# Patient Record
Sex: Male | Born: 1935 | Race: White | Hispanic: No | Smoking: Never smoker
Health system: Southern US, Community
[De-identification: ages and names within clinical notes are randomized; demographics above are authoritative.]

## PROBLEM LIST (undated history)

## (undated) DIAGNOSIS — N189 Chronic kidney disease, unspecified: Secondary | ICD-10-CM

## (undated) DIAGNOSIS — F32A Depression, unspecified: Secondary | ICD-10-CM

## (undated) DIAGNOSIS — F0390 Unspecified dementia without behavioral disturbance: Secondary | ICD-10-CM

## (undated) DIAGNOSIS — I4891 Unspecified atrial fibrillation: Secondary | ICD-10-CM

## (undated) DIAGNOSIS — I10 Essential (primary) hypertension: Secondary | ICD-10-CM

## (undated) DIAGNOSIS — F419 Anxiety disorder, unspecified: Secondary | ICD-10-CM

## (undated) DIAGNOSIS — I499 Cardiac arrhythmia, unspecified: Secondary | ICD-10-CM

## (undated) HISTORY — PX: CHOLECYSTECTOMY: SHX55

## (undated) HISTORY — PX: CATARACT EXTRACTION: SUR2

## (undated) HISTORY — DX: Unspecified atrial fibrillation: I48.91

---

## 2002-03-31 ENCOUNTER — Encounter: Payer: Self-pay | Admitting: Urology

## 2002-04-05 ENCOUNTER — Inpatient Hospital Stay (HOSPITAL_COMMUNITY): Admission: RE | Admit: 2002-04-05 | Discharge: 2002-04-06 | Payer: Self-pay | Admitting: Urology

## 2017-09-25 DIAGNOSIS — N400 Enlarged prostate without lower urinary tract symptoms: Secondary | ICD-10-CM

## 2017-09-25 DIAGNOSIS — I4891 Unspecified atrial fibrillation: Secondary | ICD-10-CM

## 2017-09-25 DIAGNOSIS — E785 Hyperlipidemia, unspecified: Secondary | ICD-10-CM

## 2017-09-25 DIAGNOSIS — I10 Essential (primary) hypertension: Secondary | ICD-10-CM

## 2017-09-25 DIAGNOSIS — R0789 Other chest pain: Secondary | ICD-10-CM

## 2017-09-26 DIAGNOSIS — R079 Chest pain, unspecified: Secondary | ICD-10-CM | POA: Diagnosis not present

## 2017-11-29 DIAGNOSIS — N390 Urinary tract infection, site not specified: Secondary | ICD-10-CM | POA: Diagnosis not present

## 2017-11-29 DIAGNOSIS — R509 Fever, unspecified: Secondary | ICD-10-CM

## 2017-11-29 DIAGNOSIS — A4189 Other specified sepsis: Secondary | ICD-10-CM

## 2017-11-29 DIAGNOSIS — I248 Other forms of acute ischemic heart disease: Secondary | ICD-10-CM

## 2017-11-30 DIAGNOSIS — R509 Fever, unspecified: Secondary | ICD-10-CM

## 2017-11-30 DIAGNOSIS — N390 Urinary tract infection, site not specified: Secondary | ICD-10-CM | POA: Diagnosis not present

## 2017-11-30 DIAGNOSIS — I248 Other forms of acute ischemic heart disease: Secondary | ICD-10-CM | POA: Diagnosis not present

## 2017-11-30 DIAGNOSIS — A4189 Other specified sepsis: Secondary | ICD-10-CM | POA: Diagnosis not present

## 2017-12-01 DIAGNOSIS — A4189 Other specified sepsis: Secondary | ICD-10-CM | POA: Diagnosis not present

## 2017-12-01 DIAGNOSIS — R509 Fever, unspecified: Secondary | ICD-10-CM | POA: Diagnosis not present

## 2017-12-01 DIAGNOSIS — I248 Other forms of acute ischemic heart disease: Secondary | ICD-10-CM | POA: Diagnosis not present

## 2017-12-01 DIAGNOSIS — N390 Urinary tract infection, site not specified: Secondary | ICD-10-CM | POA: Diagnosis not present

## 2018-07-06 DIAGNOSIS — R9431 Abnormal electrocardiogram [ECG] [EKG]: Secondary | ICD-10-CM | POA: Diagnosis not present

## 2018-07-06 DIAGNOSIS — E876 Hypokalemia: Secondary | ICD-10-CM | POA: Diagnosis not present

## 2018-07-06 DIAGNOSIS — I160 Hypertensive urgency: Secondary | ICD-10-CM | POA: Diagnosis not present

## 2018-07-06 DIAGNOSIS — R55 Syncope and collapse: Secondary | ICD-10-CM

## 2018-07-06 DIAGNOSIS — T148XXA Other injury of unspecified body region, initial encounter: Secondary | ICD-10-CM

## 2018-07-06 DIAGNOSIS — R739 Hyperglycemia, unspecified: Secondary | ICD-10-CM | POA: Diagnosis not present

## 2018-07-07 DIAGNOSIS — R739 Hyperglycemia, unspecified: Secondary | ICD-10-CM | POA: Diagnosis not present

## 2018-07-07 DIAGNOSIS — I160 Hypertensive urgency: Secondary | ICD-10-CM | POA: Diagnosis not present

## 2018-07-07 DIAGNOSIS — R9431 Abnormal electrocardiogram [ECG] [EKG]: Secondary | ICD-10-CM | POA: Diagnosis not present

## 2018-07-07 DIAGNOSIS — E876 Hypokalemia: Secondary | ICD-10-CM | POA: Diagnosis not present

## 2018-07-07 DIAGNOSIS — I361 Nonrheumatic tricuspid (valve) insufficiency: Secondary | ICD-10-CM

## 2018-07-08 DIAGNOSIS — I272 Pulmonary hypertension, unspecified: Secondary | ICD-10-CM | POA: Diagnosis not present

## 2018-07-08 DIAGNOSIS — I160 Hypertensive urgency: Secondary | ICD-10-CM | POA: Diagnosis not present

## 2018-07-08 DIAGNOSIS — R9431 Abnormal electrocardiogram [ECG] [EKG]: Secondary | ICD-10-CM | POA: Diagnosis not present

## 2018-07-08 DIAGNOSIS — R739 Hyperglycemia, unspecified: Secondary | ICD-10-CM | POA: Diagnosis not present

## 2018-07-08 DIAGNOSIS — R55 Syncope and collapse: Secondary | ICD-10-CM

## 2018-07-08 DIAGNOSIS — E876 Hypokalemia: Secondary | ICD-10-CM | POA: Diagnosis not present

## 2018-07-08 DIAGNOSIS — I4891 Unspecified atrial fibrillation: Secondary | ICD-10-CM | POA: Diagnosis not present

## 2018-07-09 DIAGNOSIS — R739 Hyperglycemia, unspecified: Secondary | ICD-10-CM | POA: Diagnosis not present

## 2018-07-09 DIAGNOSIS — E876 Hypokalemia: Secondary | ICD-10-CM | POA: Diagnosis not present

## 2018-07-09 DIAGNOSIS — R9431 Abnormal electrocardiogram [ECG] [EKG]: Secondary | ICD-10-CM | POA: Diagnosis not present

## 2018-07-09 DIAGNOSIS — I4891 Unspecified atrial fibrillation: Secondary | ICD-10-CM | POA: Diagnosis not present

## 2018-07-09 DIAGNOSIS — R55 Syncope and collapse: Secondary | ICD-10-CM | POA: Diagnosis not present

## 2018-07-09 DIAGNOSIS — I160 Hypertensive urgency: Secondary | ICD-10-CM | POA: Diagnosis not present

## 2018-07-09 DIAGNOSIS — I272 Pulmonary hypertension, unspecified: Secondary | ICD-10-CM | POA: Diagnosis not present

## 2018-07-10 DIAGNOSIS — R55 Syncope and collapse: Secondary | ICD-10-CM | POA: Diagnosis not present

## 2018-07-10 DIAGNOSIS — E876 Hypokalemia: Secondary | ICD-10-CM | POA: Diagnosis not present

## 2018-07-10 DIAGNOSIS — I272 Pulmonary hypertension, unspecified: Secondary | ICD-10-CM | POA: Diagnosis not present

## 2018-07-10 DIAGNOSIS — R739 Hyperglycemia, unspecified: Secondary | ICD-10-CM | POA: Diagnosis not present

## 2018-07-10 DIAGNOSIS — I4891 Unspecified atrial fibrillation: Secondary | ICD-10-CM | POA: Diagnosis not present

## 2018-07-10 DIAGNOSIS — I160 Hypertensive urgency: Secondary | ICD-10-CM | POA: Diagnosis not present

## 2018-07-10 DIAGNOSIS — R9431 Abnormal electrocardiogram [ECG] [EKG]: Secondary | ICD-10-CM | POA: Diagnosis not present

## 2020-05-15 ENCOUNTER — Observation Stay (HOSPITAL_COMMUNITY)
Admission: EM | Admit: 2020-05-15 | Discharge: 2020-05-18 | Disposition: A | Discharge status: Home / Self Care | Payer: Medicare Other | Attending: Internal Medicine | Admitting: Internal Medicine

## 2020-05-15 ENCOUNTER — Emergency Department (HOSPITAL_COMMUNITY): Payer: Medicare Other

## 2020-05-15 ENCOUNTER — Other Ambulatory Visit: Payer: Self-pay

## 2020-05-15 ENCOUNTER — Encounter (HOSPITAL_COMMUNITY): Payer: Self-pay

## 2020-05-15 DIAGNOSIS — I482 Chronic atrial fibrillation, unspecified: Secondary | ICD-10-CM

## 2020-05-15 DIAGNOSIS — I214 Non-ST elevation (NSTEMI) myocardial infarction: Secondary | ICD-10-CM | POA: Insufficient documentation

## 2020-05-15 DIAGNOSIS — Z79899 Other long term (current) drug therapy: Secondary | ICD-10-CM | POA: Diagnosis not present

## 2020-05-15 DIAGNOSIS — Y92009 Unspecified place in unspecified non-institutional (private) residence as the place of occurrence of the external cause: Secondary | ICD-10-CM | POA: Diagnosis not present

## 2020-05-15 DIAGNOSIS — Z7982 Long term (current) use of aspirin: Secondary | ICD-10-CM | POA: Diagnosis not present

## 2020-05-15 DIAGNOSIS — N179 Acute kidney failure, unspecified: Secondary | ICD-10-CM

## 2020-05-15 DIAGNOSIS — F17220 Nicotine dependence, chewing tobacco, uncomplicated: Secondary | ICD-10-CM | POA: Diagnosis not present

## 2020-05-15 DIAGNOSIS — I5030 Unspecified diastolic (congestive) heart failure: Secondary | ICD-10-CM | POA: Diagnosis not present

## 2020-05-15 DIAGNOSIS — J441 Chronic obstructive pulmonary disease with (acute) exacerbation: Secondary | ICD-10-CM | POA: Insufficient documentation

## 2020-05-15 DIAGNOSIS — I130 Hypertensive heart and chronic kidney disease with heart failure and stage 1 through stage 4 chronic kidney disease, or unspecified chronic kidney disease: Secondary | ICD-10-CM | POA: Insufficient documentation

## 2020-05-15 DIAGNOSIS — Z20822 Contact with and (suspected) exposure to covid-19: Secondary | ICD-10-CM | POA: Diagnosis not present

## 2020-05-15 DIAGNOSIS — I10 Essential (primary) hypertension: Secondary | ICD-10-CM

## 2020-05-15 DIAGNOSIS — R55 Syncope and collapse: Secondary | ICD-10-CM

## 2020-05-15 DIAGNOSIS — W19XXXA Unspecified fall, initial encounter: Secondary | ICD-10-CM | POA: Insufficient documentation

## 2020-05-15 DIAGNOSIS — Z23 Encounter for immunization: Secondary | ICD-10-CM | POA: Diagnosis not present

## 2020-05-15 DIAGNOSIS — E860 Dehydration: Secondary | ICD-10-CM

## 2020-05-15 DIAGNOSIS — N183 Chronic kidney disease, stage 3 unspecified: Secondary | ICD-10-CM | POA: Insufficient documentation

## 2020-05-15 DIAGNOSIS — R0902 Hypoxemia: Secondary | ICD-10-CM

## 2020-05-15 DIAGNOSIS — F0390 Unspecified dementia without behavioral disturbance: Secondary | ICD-10-CM | POA: Insufficient documentation

## 2020-05-15 DIAGNOSIS — S0990XA Unspecified injury of head, initial encounter: Secondary | ICD-10-CM | POA: Diagnosis not present

## 2020-05-15 DIAGNOSIS — R778 Other specified abnormalities of plasma proteins: Secondary | ICD-10-CM

## 2020-05-15 HISTORY — DX: Essential (primary) hypertension: I10

## 2020-05-15 HISTORY — DX: Anxiety disorder, unspecified: F41.9

## 2020-05-15 HISTORY — DX: Chronic kidney disease, unspecified: N18.9

## 2020-05-15 HISTORY — DX: Depression, unspecified: F32.A

## 2020-05-15 HISTORY — DX: Unspecified dementia, unspecified severity, without behavioral disturbance, psychotic disturbance, mood disturbance, and anxiety: F03.90

## 2020-05-15 HISTORY — DX: Cardiac arrhythmia, unspecified: I49.9

## 2020-05-15 LAB — COMPREHENSIVE METABOLIC PANEL
ALT: 15 U/L (ref 0–44)
AST: 40 U/L (ref 15–41)
Albumin: 4 g/dL (ref 3.5–5.0)
Alkaline Phosphatase: 127 U/L — ABNORMAL HIGH (ref 38–126)
Anion gap: 10 (ref 5–15)
BUN: 13 mg/dL (ref 8–23)
CO2: 24 mmol/L (ref 22–32)
Calcium: 9.3 mg/dL (ref 8.9–10.3)
Chloride: 104 mmol/L (ref 98–111)
Creatinine, Ser: 1.93 mg/dL — ABNORMAL HIGH (ref 0.61–1.24)
GFR calc Af Amer: 36 mL/min — ABNORMAL LOW (ref >60)
GFR calc non Af Amer: 31 mL/min — ABNORMAL LOW (ref >60)
Glucose, Bld: 122 mg/dL — ABNORMAL HIGH (ref 70–99)
Potassium: 4.1 mmol/L (ref 3.5–5.1)
Sodium: 138 mmol/L (ref 135–145)
Total Bilirubin: 1.2 mg/dL (ref 0.3–1.2)
Total Protein: 7.1 g/dL (ref 6.5–8.1)

## 2020-05-15 LAB — CBC WITH DIFFERENTIAL/PLATELET
Abs Immature Granulocytes: 0.07 10*3/uL (ref 0.00–0.07)
Basophils Absolute: 0 10*3/uL (ref 0.0–0.1)
Basophils Relative: 1 %
Eosinophils Absolute: 0 10*3/uL (ref 0.0–0.5)
Eosinophils Relative: 1 %
HCT: 34.4 % — ABNORMAL LOW (ref 39.0–52.0)
Hemoglobin: 10.8 g/dL — ABNORMAL LOW (ref 13.0–17.0)
Immature Granulocytes: 1 %
Lymphocytes Relative: 15 %
Lymphs Abs: 1.2 10*3/uL (ref 0.7–4.0)
MCH: 30.5 pg (ref 26.0–34.0)
MCHC: 31.4 g/dL (ref 30.0–36.0)
MCV: 97.2 fL (ref 80.0–100.0)
Monocytes Absolute: 0.6 10*3/uL (ref 0.1–1.0)
Monocytes Relative: 7 %
Neutro Abs: 6.5 10*3/uL (ref 1.7–7.7)
Neutrophils Relative %: 75 %
Platelets: 168 10*3/uL (ref 150–400)
RBC: 3.54 MIL/uL — ABNORMAL LOW (ref 4.22–5.81)
RDW: 15.3 % (ref 11.5–15.5)
WBC: 8.4 10*3/uL (ref 4.0–10.5)
nRBC: 0 % (ref 0.0–0.2)

## 2020-05-15 MED ORDER — SODIUM CHLORIDE 0.9 % IV BOLUS
1000.0000 mL | Freq: Once | INTRAVENOUS | Status: AC
  Administered 2020-05-16: 1000 mL via INTRAVENOUS

## 2020-05-15 MED ORDER — ACETAMINOPHEN 325 MG PO TABS
650.0000 mg | ORAL_TABLET | Freq: Once | ORAL | Status: AC
  Administered 2020-05-16: 650 mg via ORAL
  Filled 2020-05-15: qty 2

## 2020-05-15 MED ORDER — TETANUS-DIPHTH-ACELL PERTUSSIS 5-2.5-18.5 LF-MCG/0.5 IM SUSP
0.5000 mL | Freq: Once | INTRAMUSCULAR | Status: AC
  Administered 2020-05-16: 0.5 mL via INTRAMUSCULAR
  Filled 2020-05-15: qty 0.5

## 2020-05-15 NOTE — ED Notes (Signed)
Pt returned from radiology via stretcher by transport.  

## 2020-05-15 NOTE — ED Provider Notes (Signed)
[facility] EMERGENCY DEPARTMENT Provider Note   CSN: [id number] Arrival date & time: 05/15/20  1821     History Chief Complaint  Patient presents with  . Loss of Consciousness    Donald Arellano is a 86 y.o. male with a past medical history of A. fib not currently on anticoagulation, heart failure with preserved ejection fraction, presenting to the ED with a chief complaint of syncope.  States that he wakes up every morning for the past several months feeling dizzy.  This improves throughout the day.  Today he was trying to use the bathroom when he felt dizzy your and apparently syncopized.  Did not have any preceding chest pain.  States that right now he feels at baseline, denies any headache, blurry vision, numbness in arms or legs, chest pain, shortness of breath.  Reports normal activity and appetite level, ambulates at baseline with his walker.  States that he had similar recurrent syncope about 2 years ago but is unsure why. [facility] cardiology note states that he is not on anticoagulation for his A. fib due to recurrent falls and injuries.  He is compliant with his home medications.  HPI     Past Medical History:  Diagnosis Date  . Atrial fibrillation (HCC)     There are no problems to display for this patient.    No family history on file.  Social History   Tobacco Use  . Smoking status: Never Smoker  . Smokeless tobacco: Current User    Types: Chew  Vaping Use  . Vaping Use: Never used  Substance Use Topics  . Alcohol use: Not Currently  . Drug use: Never    Home Medications Prior to Admission medications   Medication Sig Start Date End Date Taking? Authorizing Provider  albuterol (VENTOLIN HFA) 108 (90 Base) MCG/ACT inhaler Inhale 2 puffs into the lungs every 4 (four) hours as needed for wheezing or shortness of breath.  01/04/19  Yes [provider]  aspirin EC 81 MG tablet Take 81 mg by mouth every morning. Swallow whole.   Yes  [provider]  atorvastatin (LIPITOR) 20 MG tablet Take 20 mg by mouth daily with supper. 05/15/20  Yes [provider]  cetirizine (ZYRTEC) 10 MG tablet Take 10 mg by mouth daily with supper.   Yes [provider]  donepezil (ARICEPT) 10 MG tablet Take 10 mg by mouth daily with supper. 04/02/20  Yes [provider]  metoprolol succinate (TOPROL-XL) 50 MG 24 hr tablet Take 25 mg by mouth every morning. 03/25/20  Yes [provider]  Multiple Vitamin (MULTIVITAMIN WITH MINERALS) TABS tablet Take 1 tablet by mouth daily with supper.   Yes [provider]  potassium chloride (MICRO-K) 10 MEQ CR capsule Take 10 mEq by mouth every morning. 04/22/20  Yes [provider]  sertraline (ZOLOFT) 50 MG tablet Take 75 mg by mouth every morning. 03/04/20  Yes [provider]  tamsulosin (FLOMAX) 0.4 MG CAPS capsule Take 0.4 mg by mouth every morning. 02/27/20  Yes [provider]  torsemide (DEMADEX) 20 MG tablet Take 40 mg by mouth every morning. 04/27/20  Yes [provider]  vitamin B-12 (CYANOCOBALAMIN) 1000 MCG tablet Take 1,000 mcg by mouth every morning.   Yes [provider]    Allergies    Meloxicam  Review of Systems   Review of Systems  Constitutional: Negative for appetite change, chills and fever.  HENT: Negative for ear pain, rhinorrhea,  sneezing and sore throat.   Eyes: Negative for photophobia and visual disturbance.  Respiratory: Negative for cough, chest tightness, shortness of breath and wheezing.   Cardiovascular: Negative for chest pain and palpitations.  Gastrointestinal: Negative for abdominal pain, blood in stool, constipation, diarrhea, nausea and vomiting.  Genitourinary: Negative for dysuria, hematuria and urgency.  Musculoskeletal: Negative for myalgias.  Skin: Negative for rash.  Neurological: Positive for dizziness and light-headedness. Negative for weakness.    Physical  Exam Updated Vital Signs BP 129/60 (BP Location: Left Arm)   Pulse 72   Temp 98.6 F (37 C) (Oral)   Resp 20   SpO2 93%   Physical Exam Vitals and nursing note reviewed.  Constitutional:      General: He is not in acute distress.    Appearance: He is well-developed.  HENT:     Head: Normocephalic. Abrasion present.      Nose: Nose normal.  Eyes:     General: No scleral icterus.       Right eye: No discharge.        Left eye: No discharge.     Conjunctiva/sclera: Conjunctivae normal.     Pupils: Pupils are equal, round, and reactive to light.     Comments: Constricted but reactive pupils bilaterally.  Cardiovascular:     Rate and Rhythm: Normal rate and regular rhythm.     Heart sounds: Normal heart sounds. No murmur heard.  No friction rub. No gallop.   Pulmonary:     Effort: Pulmonary effort is normal. No respiratory distress.     Breath sounds: Normal breath sounds.  Abdominal:     General: Bowel sounds are normal. There is no distension.     Palpations: Abdomen is soft.     Tenderness: There is no abdominal tenderness. There is no guarding.  Musculoskeletal:        General: Normal range of motion.     Cervical back: Normal range of motion and neck supple.     Comments: No midline spinal tenderness present in lumbar, thoracic or cervical spine. No step-off palpated. No visible bruising, edema or temperature change noted. No objective signs of numbness present. No saddle anesthesia. 2+ DP pulses bilaterally. Sensation intact to light touch.   Skin:    General: Skin is warm and dry.     Findings: No rash.     Comments: Hematoma posterior scalp. Abrasion noted to posterior scalp x2.  Neurological:     General: No focal deficit present.     Mental Status: He is alert. Mental status is at baseline.     Cranial Nerves: No cranial nerve deficit.     Sensory: No sensory deficit.     Motor: No weakness or abnormal muscle tone.     Coordination: Coordination normal.      Comments: Alert, oriented to self.  Knows it is the year 2021.  Does not know which city he is in.  Able to name family members.  No facial asymmetry noted.  Strength 5/5 in bilateral upper and lower extremities.     ED Results / Procedures / Treatments   Labs (all labs ordered are listed, but only abnormal results are displayed) Labs Reviewed  COMPREHENSIVE METABOLIC PANEL - Abnormal; Notable for the following components:      Result Value   Glucose, Bld 122 (*)    Creatinine, Ser 1.93 (*)    Alkaline Phosphatase 127 (*)    GFR calc non Af Amer 31 (*)  GFR calc Af Amer 36 (*)    All other components within normal limits  CBC WITH DIFFERENTIAL/PLATELET - Abnormal; Notable for the following components:   RBC 3.54 (*)    Hemoglobin 10.8 (*)    HCT 34.4 (*)    All other components within normal limits  URINE CULTURE  URINALYSIS, ROUTINE W REFLEX MICROSCOPIC  TROPONIN I (HIGH SENSITIVITY)    EKG EKG Interpretation  Date/Time:  Monday May 15 2020 20:35:33 EDT Ventricular Rate:  62 PR Interval:    QRS Duration: 104 QT Interval:  490 QTC Calculation: 497 R Axis:   118 Text Interpretation: Atrial fibrillation with premature ventricular or aberrantly conducted complexes Nonspecific ST abnormality Prolonged QT Confirmed by Cathren Laine ([id number]) on 05/15/2020 8:54:11 PM   Radiology DG Chest 2 View  Result Date: 05/15/2020 CLINICAL DATA:  Loss of consciousness, syncope EXAM: CHEST - 2 VIEW COMPARISON:  07/06/2018 FINDINGS: Frontal and lateral views of the chest demonstrate a stable enlarged cardiac silhouette. Continued ectasia of the thoracic aorta. There is diffuse interstitial prominence, with small bilateral pleural effusions. No pneumothorax. IMPRESSION: 1. Mild interstitial edema. Electronically Signed   By: Sharlet Salina M.D.   On: 05/15/2020 19:35   CT Head Wo Contrast  Result Date: 05/15/2020 CLINICAL DATA:  Syncope.  Hematoma to posterior head. EXAM: CT HEAD  WITHOUT CONTRAST TECHNIQUE: Contiguous axial images were obtained from the base of the skull through the vertex without intravenous contrast. COMPARISON:  07/06/2018 FINDINGS: Brain: No evidence of acute infarction, hemorrhage, hydrocephalus, extra-axial collection or mass lesion/mass effect. There is mild diffuse low-attenuation within the subcortical and periventricular white matter compatible with chronic microvascular disease. Vascular: No hyperdense vessel or unexpected calcification. Skull: Normal. Negative for fracture or focal lesion. Sinuses/Orbits: No acute finding. Other: Right posterior scalp laceration and hematoma measures 0.9 x 3.6 cm, image 11/3 IMPRESSION: 1. No acute intracranial abnormalities. 2. Chronic small vessel ischemic disease and brain atrophy. 3. Right posterior scalp laceration and hematoma. Electronically Signed   By: Signa Kell M.D.   On: 05/15/2020 19:21    Procedures Procedures (including critical care time)  Medications Ordered in ED Medications  Tdap (BOOSTRIX) injection 0.5 mL (has no administration in time range)  sodium chloride 0.9 % bolus 1,000 mL (has no administration in time range)    ED Course  I have reviewed the triage vital signs and the nursing notes.  Pertinent labs & imaging results that were available during my care of the patient were reviewed by me and considered in my medical decision making (see chart for details).  Clinical Course as of May 15 2100  Mon May 15, 2020  2047 Care everywhere shows most recent lab work from March 2020 with creatinine of 1.07.  Creatinine(!): 1.93 [HK]    Clinical Course User Index [HK] Dietrich Pates, PA-C   MDM Rules/Calculators/A&P                          86 year old male with past medical history of A. fib not currently on anticoagulation, heart failure with preserved ejection fraction presenting to the ED with a chief complaint of syncope.  Wakes up every morning for the past several months feeling  dizzy.  This improves throughout the day.  An episode of dizziness when he went to the bathroom today followed by a syncopal episode.  No preceding current chest pain.  Feels at his baseline now.  Denies headache, blurry vision, numbness in arms  or legs, shortness of breath.  Ambulates with a walker at baseline.  Patient with hematoma and abrasion to posterior scalp. No numbness or weakness noted of bilateral upper and lower extremities.  He is alert, oriented to his baseline.  Moving all extremities without difficulty.  No C, T or L-spine tenderness at the midline..  CT of the head without any acute abnormality does show a scalp hematoma and laceration.  Wound care performed and Tdap updated.  CMP with creatinine of 1.9, most recent lab work from March 2020 which showed creatinine of 1. Will treat with IV fluids, as this could be the cause of his dizziness. Care handed off to Dr. Denton Lank pending remainder of work-up and disposition.  Portions of this note were generated with Scientist, clinical (histocompatibility and immunogenetics). Dictation errors may occur despite best attempts at proofreading.  Final Clinical Impression(s) / ED Diagnoses Final diagnoses:  Fall in home, initial encounter  Injury of head, initial encounter  Dehydration    Rx / DC Orders ED Discharge Orders    None       Brooks Sailors 05/15/20 2113    Cathren Laine, MD 05/15/20 2135    Cathren Laine, MD 05/15/20 2356    Cathren Laine, MD 05/16/20 0009

## 2020-05-15 NOTE — ED Triage Notes (Signed)
Pt to hall 3 via [facility]. Pt was having a BM and passed out at home. Pt's niece whom he lives with heard him and she went to check on him. Pt was unresponsive for approximately . Pt alert, confused on arrival. Pt states he doesn't feel good and hasn't felt good for weeks. Pt has large hematoma to posterior head. Pt's niece is POA, Viviana Simpler, [phone].

## 2020-05-15 NOTE — ED Notes (Signed)
Donald Arellano  [phone]  POA please call  with an update

## 2020-05-16 ENCOUNTER — Encounter (HOSPITAL_COMMUNITY): Payer: Self-pay | Admitting: Family Medicine

## 2020-05-16 ENCOUNTER — Observation Stay (HOSPITAL_BASED_OUTPATIENT_CLINIC_OR_DEPARTMENT_OTHER): Payer: Medicare Other

## 2020-05-16 DIAGNOSIS — I214 Non-ST elevation (NSTEMI) myocardial infarction: Secondary | ICD-10-CM | POA: Diagnosis not present

## 2020-05-16 DIAGNOSIS — I361 Nonrheumatic tricuspid (valve) insufficiency: Secondary | ICD-10-CM | POA: Diagnosis not present

## 2020-05-16 DIAGNOSIS — I340 Nonrheumatic mitral (valve) insufficiency: Secondary | ICD-10-CM | POA: Diagnosis not present

## 2020-05-16 DIAGNOSIS — R55 Syncope and collapse: Secondary | ICD-10-CM

## 2020-05-16 DIAGNOSIS — I10 Essential (primary) hypertension: Secondary | ICD-10-CM

## 2020-05-16 DIAGNOSIS — N183 Chronic kidney disease, stage 3 unspecified: Secondary | ICD-10-CM

## 2020-05-16 DIAGNOSIS — I482 Chronic atrial fibrillation, unspecified: Secondary | ICD-10-CM

## 2020-05-16 LAB — BASIC METABOLIC PANEL
Anion gap: 12 (ref 5–15)
BUN: 15 mg/dL (ref 8–23)
CO2: 20 mmol/L — ABNORMAL LOW (ref 22–32)
Calcium: 8.8 mg/dL — ABNORMAL LOW (ref 8.9–10.3)
Chloride: 105 mmol/L (ref 98–111)
Creatinine, Ser: 1.9 mg/dL — ABNORMAL HIGH (ref 0.61–1.24)
GFR calc Af Amer: 36 mL/min — ABNORMAL LOW (ref >60)
GFR calc non Af Amer: 31 mL/min — ABNORMAL LOW (ref >60)
Glucose, Bld: 101 mg/dL — ABNORMAL HIGH (ref 70–99)
Potassium: 3.6 mmol/L (ref 3.5–5.1)
Sodium: 137 mmol/L (ref 135–145)

## 2020-05-16 LAB — ECHOCARDIOGRAM COMPLETE: S' Lateral: 3.1 cm

## 2020-05-16 LAB — URINALYSIS, ROUTINE W REFLEX MICROSCOPIC
Bacteria, UA: NONE SEEN
Bilirubin Urine: NEGATIVE
Glucose, UA: NEGATIVE mg/dL
Hgb urine dipstick: NEGATIVE
Ketones, ur: NEGATIVE mg/dL
Nitrite: NEGATIVE
Protein, ur: NEGATIVE mg/dL
Specific Gravity, Urine: 1.004 — ABNORMAL LOW (ref 1.005–1.030)
pH: 7 (ref 5.0–8.0)

## 2020-05-16 LAB — CBC
HCT: 32.5 % — ABNORMAL LOW (ref 39.0–52.0)
Hemoglobin: 10.4 g/dL — ABNORMAL LOW (ref 13.0–17.0)
MCH: 30.4 pg (ref 26.0–34.0)
MCHC: 32 g/dL (ref 30.0–36.0)
MCV: 95 fL (ref 80.0–100.0)
Platelets: 146 10*3/uL — ABNORMAL LOW (ref 150–400)
RBC: 3.42 MIL/uL — ABNORMAL LOW (ref 4.22–5.81)
RDW: 15.3 % (ref 11.5–15.5)
WBC: 7.4 10*3/uL (ref 4.0–10.5)
nRBC: 0 % (ref 0.0–0.2)

## 2020-05-16 LAB — SARS CORONAVIRUS 2 BY RT PCR (HOSPITAL ORDER, PERFORMED IN ~~LOC~~ HOSPITAL LAB): SARS Coronavirus 2: NEGATIVE

## 2020-05-16 LAB — TROPONIN I (HIGH SENSITIVITY)
Troponin I (High Sensitivity): 133 ng/L (ref <18)
Troponin I (High Sensitivity): 132 ng/L (ref <18)
Troponin I (High Sensitivity): 122 ng/L (ref <18)
Troponin I (High Sensitivity): 94 ng/L — ABNORMAL HIGH (ref <18)

## 2020-05-16 MED ORDER — ASPIRIN EC 81 MG PO TBEC
81.0000 mg | DELAYED_RELEASE_TABLET | Freq: Every day | ORAL | Status: DC
  Administered 2020-05-17 – 2020-05-18 (×2): 81 mg via ORAL
  Filled 2020-05-16 (×2): qty 1

## 2020-05-16 MED ORDER — SERTRALINE HCL 50 MG PO TABS
75.0000 mg | ORAL_TABLET | Freq: Every morning | ORAL | Status: DC
  Administered 2020-05-16 – 2020-05-18 (×3): 75 mg via ORAL
  Filled 2020-05-16 (×2): qty 1
  Filled 2020-05-16 (×2): qty 2

## 2020-05-16 MED ORDER — TAMSULOSIN HCL 0.4 MG PO CAPS
0.4000 mg | ORAL_CAPSULE | Freq: Every morning | ORAL | Status: DC
  Administered 2020-05-16 – 2020-05-18 (×3): 0.4 mg via ORAL
  Filled 2020-05-16 (×3): qty 1

## 2020-05-16 MED ORDER — NITROGLYCERIN 0.4 MG SL SUBL: 0.4000 mg | SUBLINGUAL_TABLET | SUBLINGUAL | Status: DC | PRN

## 2020-05-16 MED ORDER — HEPARIN SODIUM (PORCINE) 5000 UNIT/ML IJ SOLN
5000.0000 [IU] | Freq: Three times a day (TID) | INTRAMUSCULAR | Status: DC
  Administered 2020-05-16 – 2020-05-18 (×7): 5000 [IU] via SUBCUTANEOUS
  Filled 2020-05-16 (×6): qty 1

## 2020-05-16 MED ORDER — LACTATED RINGERS IV SOLN
INTRAVENOUS | Status: DC
  Administered 2020-05-16: 50 mL via INTRAVENOUS

## 2020-05-16 MED ORDER — DONEPEZIL HCL 10 MG PO TABS
10.0000 mg | ORAL_TABLET | Freq: Every day | ORAL | Status: DC
  Administered 2020-05-16 – 2020-05-17 (×2): 10 mg via ORAL
  Filled 2020-05-16 (×3): qty 1

## 2020-05-16 MED ORDER — METOPROLOL SUCCINATE ER 25 MG PO TB24
25.0000 mg | ORAL_TABLET | Freq: Every morning | ORAL | Status: DC
  Administered 2020-05-16 – 2020-05-18 (×3): 25 mg via ORAL
  Filled 2020-05-16 (×3): qty 1

## 2020-05-16 MED ORDER — ONDANSETRON HCL 4 MG/2ML IJ SOLN: 4.0000 mg | Freq: Four times a day (QID) | INTRAMUSCULAR | Status: DC | PRN

## 2020-05-16 MED ORDER — ACETAMINOPHEN 325 MG PO TABS: 650.0000 mg | ORAL_TABLET | ORAL | Status: DC | PRN

## 2020-05-16 MED ORDER — ASPIRIN EC 81 MG PO TBEC
81.0000 mg | DELAYED_RELEASE_TABLET | Freq: Every morning | ORAL | Status: DC
  Administered 2020-05-16: 81 mg via ORAL
  Filled 2020-05-16: qty 1

## 2020-05-16 MED ORDER — ATORVASTATIN CALCIUM 10 MG PO TABS
20.0000 mg | ORAL_TABLET | Freq: Every day | ORAL | Status: DC
  Administered 2020-05-16 – 2020-05-17 (×2): 20 mg via ORAL
  Filled 2020-05-16 (×2): qty 2

## 2020-05-16 MED ORDER — TORSEMIDE 20 MG PO TABS
40.0000 mg | ORAL_TABLET | Freq: Every morning | ORAL | Status: DC
  Administered 2020-05-16 – 2020-05-18 (×3): 40 mg via ORAL
  Filled 2020-05-16 (×3): qty 2

## 2020-05-16 NOTE — Progress Notes (Signed)
PROGRESS NOTE    Donald Arellano  ZOX:[id number] DOB: 26-Nov-1931 DOA: 05/15/2020 PCP: Mare Ferrari, MD   Brief Narrative:  Donald Arellano is a 86 y.o. male with medical history significant for atrial fibrillation not on anticoagulation, CKD, mild dementia, hypertension who presents after syncopal event at home.  Reports he went to the bathroom and after having a bowel movement he stood up and felt very lightheaded so he sat back down the toilet and the next thing he knows he woke up on the floor.  His niece who lives with found him on the floor.  No report of any seizure activity.  No family is at bedside.  Patient denies having any chest pain or palpitations prior to the event.  He states that when he stood up he felt lightheaded but was not dizzy and just had a feeling of generalized weakness.  He denies any shortness of breath, diaphoresis, nausea, vomiting.  He has had syncopal events in the past.  He is not on anticoagulation with his chronic atrial fibrillation secondary to multiple falls at home.  At this time he states that he feels in his normal state of health and has no complaints. Denies tobacco, alcohol, illicit drug use.  Lives with his niece and her husband. In the emergency room patient was found to have an elevated troponin level.  Remainder of work-up was unremarkable.  EKG shows atrial fibrillation with prolonged QT interval but no acute ST elevation or depression.   Assessment & Plan:   Principal Problem:   NSTEMI (non-ST elevated myocardial infarction) [facility]) Active Problems:   Syncope   Atrial fibrillation, chronic (HCC)   Essential hypertension   CKD (chronic kidney disease) stage 3, GFR 30-59 ml/min   Syncope, likely orthostatic, POA Check orthostatic blood pressures upon arrival to floor - pending Syncopal episode after he stood up PT/OT to follow  NSTEMI (non-ST elevated myocardial infarction) (HCC) Troponin minimally down trending - currently 94, no longer  following Likely secondary to transient low BP (supply demand mismatch - type 2)  Atrial fibrillation, chronic (HCC) Patient has atrial fibrillation and is not anticoagulated secondary to multiple falls and syncopal events in past - high risk for bleeding events  Essential hypertension Continue metoprolol.  Monitor blood pressure  CKD (chronic kidney disease) stage 3, GFR 30-59 ml/min Appears to be at baseline, follow labs   DVT prophylaxis:  Padua score elevated.  Heparin for DVT prophylaxis Code Status:   Full code Family Communication: At bedside  Status is: Observation  Dispo: The patient is from: Home              Anticipated d/c is to: Home              Anticipated d/c date is: 24-48h              Patient currently not medically stable for discharge due to ongoing need for evaluation and workup with repeat labs and physical therapy.  Consultants:   None  Procedures:   None  Antimicrobials:  None indicated   Subjective: No acute issues/events overnight - denies chest pain, nausea, vomiting, headache, fevers or chills  Objective: Vitals:   05/16/20 0245 05/16/20 0300 05/16/20 0315 05/16/20 0832  BP:  (!) 158/86  126/86  Pulse: 69 68 70 61  Resp: 20 18 (!) 22 20  Temp:      TempSrc:      SpO2: 90% 94% 90% 94%   No intake or output  data in the 24 hours ending 05/16/20 0856 There were no vitals filed for this visit.  Examination:  General exam: Appears calm and comfortable  Respiratory system: Clear to auscultation. Respiratory effort normal. Cardiovascular system: S1 & S2 heard, RRR. No JVD, murmurs, rubs, gallops or clicks. No pedal edema. Gastrointestinal system: Abdomen is nondistended, soft and nontender. No organomegaly or masses felt. Normal bowel sounds heard. Central nervous system: Alert and oriented. No focal neurological deficits. Extremities: Symmetric 5 x 5 power. Skin: No rashes, lesions or ulcers Psychiatry: Judgement and insight  appear normal. Mood & affect appropriate.     Data Reviewed: I have personally reviewed following labs and imaging studies  CBC: Recent Labs  Lab 05/15/20 1944 05/16/20 0433  WBC 8.4 7.4  NEUTROABS 6.5  --   HGB 10.8* 10.4*  HCT 34.4* 32.5*  MCV 97.2 95.0  PLT 168 146*   Basic Metabolic Panel: Recent Labs  Lab 05/15/20 1944 05/16/20 0433  NA 138 137  K 4.1 3.6  CL 104 105  CO2 24 20*  GLUCOSE 122* 101*  BUN 13 15  CREATININE 1.93* 1.90*  CALCIUM 9.3 8.8*   GFR: CrCl cannot be calculated (Unknown ideal weight.). Liver Function Tests: Recent Labs  Lab 05/15/20 1944  AST 40  ALT 15  ALKPHOS 127*  BILITOT 1.2  PROT 7.1  ALBUMIN 4.0   No results for input(s): LIPASE, AMYLASE in the last 168 hours. No results for input(s): AMMONIA in the last 168 hours. Coagulation Profile: No results for input(s): INR, PROTIME in the last 168 hours. Cardiac Enzymes: No results for input(s): CKTOTAL, CKMB, CKMBINDEX, TROPONINI in the last 168 hours. BNP (last 3 results) No results for input(s): PROBNP in the last 8760 hours. HbA1C: No results for input(s): HGBA1C in the last 72 hours. CBG: No results for input(s): GLUCAP in the last 168 hours. Lipid Profile: No results for input(s): CHOL, HDL, LDLCALC, TRIG, CHOLHDL, LDLDIRECT in the last 72 hours. Thyroid Function Tests: No results for input(s): TSH, T4TOTAL, FREET4, T3FREE, THYROIDAB in the last 72 hours. Anemia Panel: No results for input(s): VITAMINB12, FOLATE, FERRITIN, TIBC, IRON, RETICCTPCT in the last 72 hours. Sepsis Labs: No results for input(s): PROCALCITON, LATICACIDVEN in the last 168 hours.  Recent Results (from the past 240 hour(s))  SARS Coronavirus 2 by RT PCR (hospital order, performed in [facility] hospital lab) Nasopharyngeal Nasopharyngeal Swab     Status: None   Collection Time: 05/16/20  3:11 AM   Specimen: Nasopharyngeal Swab  Result Value Ref Range Status   SARS Coronavirus 2 NEGATIVE NEGATIVE  Final    Comment: (NOTE) SARS-CoV-2 target nucleic acids are NOT DETECTED.  The SARS-CoV-2 RNA is generally detectable in upper and lower respiratory specimens during the acute phase of infection. The lowest concentration of SARS-CoV-2 viral copies this assay can detect is 250 copies / mL. A negative result does not preclude SARS-CoV-2 infection and should not be used as the sole basis for treatment or other patient management decisions.  A negative result may occur with improper specimen collection / handling, submission of specimen other than nasopharyngeal swab, presence of viral mutation(s) within the areas targeted by this assay, and inadequate number of viral copies (<250 copies / mL). A negative result must be combined with clinical observations, patient history, and epidemiological information.  Fact Sheet for Patients:   BoilerBrush.com.cy  Fact Sheet for Healthcare Providers: https://pope.com/  This test is not yet approved or  cleared by the [place] FDA  and has been authorized for detection and/or diagnosis of SARS-CoV-2 by FDA under an Emergency Use Authorization (EUA).  This EUA will remain in effect (meaning this test can be used) for the duration of the COVID-19 declaration under Section 564(b)(1) of the Act, 21 U.S.C. section 360bbb-3(b)(1), unless the authorization is terminated or revoked sooner.  Performed at [facility], [address], [place], [place] [address]          Radiology Studies: DG Chest 2 View  Result Date: 05/15/2020 CLINICAL DATA:  Loss of consciousness, syncope EXAM: CHEST - 2 VIEW COMPARISON:  07/06/2018 FINDINGS: Frontal and lateral views of the chest demonstrate a stable enlarged cardiac silhouette. Continued ectasia of the thoracic aorta. There is diffuse interstitial prominence, with small bilateral pleural effusions. No pneumothorax. IMPRESSION: 1. Mild interstitial edema.  Electronically Signed   By: Sharlet Salina M.D.   On: 05/15/2020 19:35   CT Head Wo Contrast  Result Date: 05/15/2020 CLINICAL DATA:  Syncope.  Hematoma to posterior head. EXAM: CT HEAD WITHOUT CONTRAST TECHNIQUE: Contiguous axial images were obtained from the base of the skull through the vertex without intravenous contrast. COMPARISON:  07/06/2018 FINDINGS: Brain: No evidence of acute infarction, hemorrhage, hydrocephalus, extra-axial collection or mass lesion/mass effect. There is mild diffuse low-attenuation within the subcortical and periventricular white matter compatible with chronic microvascular disease. Vascular: No hyperdense vessel or unexpected calcification. Skull: Normal. Negative for fracture or focal lesion. Sinuses/Orbits: No acute finding. Other: Right posterior scalp laceration and hematoma measures 0.9 x 3.6 cm, image 11/3 IMPRESSION: 1. No acute intracranial abnormalities. 2. Chronic small vessel ischemic disease and brain atrophy. 3. Right posterior scalp laceration and hematoma. Electronically Signed   By: Signa Kell M.D.   On: 05/15/2020 19:21        Scheduled Meds: . aspirin EC  81 mg Oral q morning - 10a  . [START ON 05/17/2020] aspirin EC  81 mg Oral Daily  . atorvastatin  20 mg Oral Q supper  . donepezil  10 mg Oral Q supper  . heparin  5,000 Units Subcutaneous Q8H  . metoprolol succinate  25 mg Oral q morning - 10a  . sertraline  75 mg Oral q morning - 10a  . tamsulosin  0.4 mg Oral q morning - 10a  . torsemide  40 mg Oral q morning - 10a   Continuous Infusions: . lactated ringers 50 mL (05/16/20 0522)     LOS: 0 days   sTime spent:  Azucena Fallen, DO [facility]  If 7PM-7AM, please contact night-coverage [email or web address]  05/16/2020, 8:56 AM

## 2020-05-16 NOTE — Progress Notes (Signed)
Pt seen for PT. Overall at a min guard level for safety when using RW. Feel pt is close to baseline, and will not require PT follow up at d/c. Formal note to follow.   Farley Ly, PT, DPT  Acute Rehabilitation Services  Pager: [phone] Office: [phone]

## 2020-05-16 NOTE — ED Notes (Signed)
Pt unable to stand up during orthostatic VS d/t dizziness

## 2020-05-16 NOTE — Progress Notes (Addendum)
Occupational Therapy Evaluation Patient Details Name: Donald Arellano MRN: [id number] DOB: 1931-09-19 Today's Date: 05/16/2020    History of Present Illness  86 y.o. male with medical history significant for atrial fibrillation not on anticoagulation, CKD, mild dementia, hypertension who presents after syncopal event at home.  Reports he went to the bathroom and after having a bowel movement he stood up and felt very lightheaded so he sat back down the toilet and the next thing he knows he woke up on the floor.     Clinical Impression   PTA, pt lived with his niece and her husband and was modified independent with ADL and mobility @ RW level. States his niece works during the day but her husband is retired and is with him during the day. Pt states he has not had a fall in 2 years and was able to recount events leading to ED visit. Pt with mild complaints of dizziness upon sitting, which improved after sitting a few minutes. Able to ambulate to the bathroom and complete ADL tasks with minguard A @ RW level. Feel Pt safe to DC home when medically stable however recommend 24/7 S. Discussed with nsg/CM. No further acute OT needs.   Orthostatic BPs  Supine 133/78  Sitting 135/79  Sitting aftermin   Standing 118/73  Standing after activity 133/72      Follow Up Recommendations  No OT follow up;Supervision/Assistance - 24 hour    Equipment Recommendations  None recommended by OT    Recommendations for Other Services       Precautions / Restrictions Precautions Precautions: Fall      Mobility Bed Mobility Overal bed mobility: Needs Assistance             General bed mobility comments: min A gettin goff stretcher  Transfers Overall transfer level: Needs assistance   Transfers: Sit to/from Stand;Stand Pivot Transfers Sit to Stand: Min guard Stand pivot transfers: Supervision            Balance Overall balance assessment: Needs assistance   Sitting balance-Leahy Scale:  Good       Standing balance-Leahy Scale: Poor Standing balance comment: reliant on baseline but able to release for pericare                           ADL either performed or assessed with clinical judgement   ADL Overall ADL's : Needs assistance/impaired     Grooming: Standing;Supervision/safety   Upper Body Bathing: Set up;Sitting   Lower Body Bathing: Supervison/ safety;Set up;Sit to/from stand   Upper Body Dressing : Set up;Sitting   Lower Body Dressing: Set up;Supervision/safety;Sit to/from stand   Toilet Transfer: Min guard;Ambulation;RW   Toileting- Clothing Manipulation and Hygiene: Supervision/safety       Functional mobility during ADLs: Min guard;Rolling walker General ADL Comments: DOE wtih activity, however pt states that is his baseline     Vision Baseline Vision/History: Wears glasses Wears Glasses: Reading only Vision Assessment?: No apparent visual deficits     Perception     Praxis      Pertinent Vitals/Pain Pain Assessment: No/denies pain     Hand Dominance  (both)   Extremity/Trunk Assessment Upper Extremity Assessment Upper Extremity Assessment: Overall WFL for tasks assessed   Lower Extremity Assessment Lower Extremity Assessment: Defer to PT evaluation   Cervical / Trunk Assessment Cervical / Trunk Assessment: Other exceptions (forward head)   Communication Communication Communication: HOH   Cognition Arousal/Alertness: Awake/alert  Behavior During Therapy: [facility] for tasks assessed/performed Overall Cognitive Status: No family/caregiver present to determine baseline cognitive functioning                                 General Comments: hx of dementia; most likely at baseline; aware of situation; does not know month or name of hospital but knows his shower day is on WEdnesday, which is tomorrow   General Comments   Rexford Maus (niece's husband) helps with medication management    Exercises     Shoulder  Instructions      Home Living Family/patient expects to be discharged to:: Private residence Living Arrangements: Other relatives (niece and her husband) Available Help at Discharge: Family;Available 24 hours/day Type of Home: House Home Access: Ramped entrance     Home Layout: One level     Bathroom Shower/Tub: Producer, television/film/video: Handicapped height Bathroom Accessibility: Yes How Accessible: Accessible via walker Home Equipment: Walker - 2 wheels;Bedside commode;Shower seat;Wheelchair - manual;Cane - single point;Grab bars - tub/shower;Grab bars - toilet          Prior Functioning/Environment Level of Independence: Independent with assistive device(s)        Comments: fixes his own coffee and breakfast; weighs himself then watches his cowboy movie; shower day is on Wednesday and he does this independently        OT Problem List: Decreased safety awareness;Cardiopulmonary status limiting activity      OT Treatment/Interventions:      OT Goals(Current goals can be found in the care plan section) Acute Rehab OT Goals Patient Stated Goal: to go home OT Goal Formulation: All assessment and education complete, DC therapy  OT Frequency:     Barriers to D/C:            Co-evaluation              AM-PAC OT "6 Clicks" Daily Activity     Outcome Measure Help from another person eating meals?: None Help from another person taking care of personal grooming?: A Little Help from another person toileting, which includes using toliet, bedpan, or urinal?: A Little Help from another person bathing (including washing, rinsing, drying)?: A Little Help from another person to put on and taking off regular upper body clothing?: A Little Help from another person to put on and taking off regular lower body clothing?: A Little 6 Click Score: 19   End of Session Equipment Utilized During Treatment: Gait belt;Rolling walker Nurse Communication: Mobility  status;Other (comment) (DC needs)  Activity Tolerance: Patient tolerated treatment well Patient left: in bed;with call bell/phone within reach  OT Visit Diagnosis: Unsteadiness on feet (R26.81)                Time: 5035-4656 OT Time Calculation (min): 50 min Charges:  OT General Charges $OT Visit: 1 Visit OT Evaluation $OT Eval Low Complexity: 1 Low OT Treatments $Self Care/Home Management : 23-37 mins  Luisa Dago, OT/L   Acute OT Clinical Specialist Acute Rehabilitation Services Pager [phone] Office [phone]   [facility] 05/16/2020, 11:23 AM

## 2020-05-16 NOTE — ED Notes (Signed)
Pt transported to echo 

## 2020-05-16 NOTE — ED Notes (Signed)
Dr. Lancaster at bedside 

## 2020-05-16 NOTE — ED Notes (Signed)
Pt incontinent of urine, pt cleaned and linen replaced. 

## 2020-05-16 NOTE — Progress Notes (Signed)
  Echocardiogram 2D Echocardiogram has been performed.  Donald Arellano 05/16/2020, 3:13 PM

## 2020-05-16 NOTE — Discharge Instructions (Addendum)
It was our pleasure to provide your ER care today - we hope that you feel better.  Rest. Drink plenty of fluids.   Fall precautions.   From today's labs, your kidney function test (creatinine 1.9) was increased as compared to a year ago - avoid any anti-inflammatory meds such as ibuprofen/motrin, or aleve/naprosyn.  Drink adequate fluids. Follow up with your doctor in the next few days for recheck - call office tomorrow AM to arrange follow up appointment.   Return to ER if worse, new symptoms, fevers, chest pain, trouble breathing, new or severe pain, persistent vomiting, increased weakness, recurrent fainting episode(s), or other concern.

## 2020-05-16 NOTE — Progress Notes (Addendum)
Pt oxygen saturation had gone down to 82 % on room air while admitting him. Had concurrently c/o shortness of breath. Placed initially on 5 liters  Per nasal cannula. O2 sat climbed to 100 %. Decreased O2 to 3 L  With pt maintaining saturation in the 94-97 % range. Will continue to monitor.

## 2020-05-16 NOTE — ED Provider Notes (Signed)
I assumed care of this patient.  Please see previous provider note for further details of Hx, PE.  Briefly patient is a 86 y.o. male who presented syncope and generalized weakness. Work up thus far reassuring. Plan to follow up delta trop and ua.  ua not concerning for infection  Trop elevated.  Compared to OSH labs, this is new as of March of 2020.  Patient updated on findings. Will admit for trop trending and further work up.  .Critical Care Performed by: Nira Conn, MD Authorized by: Nira Conn, MD     CRITICAL CARE Performed by: Amadeo Garnet Kellianne Ek Total critical care time: 30 minutes Critical care time was exclusive of separately billable procedures and treating other patients. Critical care was necessary to treat or prevent imminent or life-threatening deterioration. Critical care was time spent personally by me on the following activities: development of treatment plan with patient and/or surrogate as well as nursing, discussions with consultants, evaluation of patient's response to treatment, examination of patient, obtaining history from patient or surrogate, ordering and performing treatments and interventions, ordering and review of laboratory studies, ordering and review of radiographic studies, pulse oximetry and re-evaluation of patient's condition.            Nira Conn, MD 05/16/20 [phone]

## 2020-05-16 NOTE — H&P (Signed)
History and Physical    Donald GemmaFrank J Batalla WUJ:[id number] DOB: [birth year] DOA: 05/15/2020  PCP: Mare FerrariHsieh, Stephen, MD   Patient coming from: Home  Chief Complaint: Syncopal episode  HPI: Donald Arellano is a 86 y.o. male with medical history significant for atrial fibrillation not on anticoagulation, CKD, mild dementia, hypertension who presents after syncopal event at home.  Reports he went to the bathroom and after having a bowel movement he stood up and felt very lightheaded so he sat back down the toilet and the next thing he knows he woke up on the floor.  His niece who lives with found him on the floor.  No report of any seizure activity.  No family is at bedside.  Patient denies having any chest pain or palpitations prior to the event.  He states that when he stood up he felt lightheaded but was not dizzy and just had a feeling of generalized weakness.  He denies any shortness of breath, diaphoresis, nausea, vomiting.  He has had syncopal events in the past.  He is not on anticoagulation with his chronic atrial fibrillation secondary to multiple falls at home.  At this time he states that he feels in his normal state of health and has no complaints. Denies tobacco, alcohol, illicit drug use.  Lives with his niece and her husband.  ED Course: In the emergency room patient was found to have an elevated troponin level.  Remainder of work-up was unremarkable.  EKG shows atrial fibrillation with prolonged QT interval but no acute ST elevation or depression.  Review of Systems:  General: Denies fever, chills, weight loss, night sweats.  Denies dizziness.  Denies change in appetite HENT: Denies headache, denies change in hearing, tinnitus. Denies nasal congestion or bleeding. Denies sore throat. Denies difficulty swallowing Eyes: Denies blurry vision, pain in eye, drainage.  Denies discoloration of eyes. Neck: Denies pain.  Denies swelling.  Denies pain with movement. Cardiovascular: Denies chest pain.   Denies palpitations.  Denies edema.  Denies orthopnea Respiratory: Denies shortness of breath, cough.  Denies wheezing.  Denies sputum production Gastrointestinal: Denies abdominal pain, swelling.  Denies nausea, vomiting, diarrhea.  Denies melena.  Denies hematemesis. Musculoskeletal: Denies limitation of movement.  Denies deformity or swelling.  Denies pain.  Denies arthralgias or myalgias. Genitourinary: Denies pelvic pain.  Denies urinary frequency or hesitancy.  Denies dysuria.  Skin: Denies rash.  Denies petechiae, purpura, ecchymosis. Neurological: Denies headache. Denies seizure activity. Denies weakness or paresthesia. Denies slurred speech, drooping face.  Denies visual change. Psychiatric: Denies depression, anxiety.  Denies suicidal thoughts or ideation.  Denies hallucinations.  Past Medical History:  Diagnosis Date  . Anxiety   . Atrial fibrillation (HCC)   . Chronic kidney disease   . Dementia (HCC)   . Depression   . Dysrhythmia   . Hypertension     History reviewed. No pertinent surgical history.  Social History  reports that he has never smoked. His smokeless tobacco use includes chew. He reports previous alcohol use. He reports that he does not use drugs.  Allergies  Allergen Reactions  . Meloxicam Swelling and Other (See Comments)    "Made me drunk"     History reviewed. No pertinent family history.   Prior to Admission medications   Medication Sig Start Date End Date Taking? Authorizing Provider  albuterol (VENTOLIN HFA) 108 (90 Base) MCG/ACT inhaler Inhale 2 puffs into the lungs every 4 (four) hours as needed for wheezing or shortness of breath.  01/04/19  Yes [provider]  aspirin EC 81 MG tablet Take 81 mg by mouth every morning. Swallow whole.   Yes [provider]  atorvastatin (LIPITOR) 20 MG tablet Take 20 mg by mouth daily with supper. 05/15/20  Yes [provider]  cetirizine (ZYRTEC) 10 MG tablet Take 10 mg by mouth  daily with supper.   Yes [provider]  donepezil (ARICEPT) 10 MG tablet Take 10 mg by mouth daily with supper. 04/02/20  Yes [provider]  metoprolol succinate (TOPROL-XL) 50 MG 24 hr tablet Take 25 mg by mouth every morning. 03/25/20  Yes [provider]  Multiple Vitamin (MULTIVITAMIN WITH MINERALS) TABS tablet Take 1 tablet by mouth daily with supper.   Yes [provider]  potassium chloride (MICRO-K) 10 MEQ CR capsule Take 10 mEq by mouth every morning. 04/22/20  Yes [provider]  sertraline (ZOLOFT) 50 MG tablet Take 75 mg by mouth every morning. 03/04/20  Yes [provider]  tamsulosin (FLOMAX) 0.4 MG CAPS capsule Take 0.4 mg by mouth every morning. 02/27/20  Yes [provider]  torsemide (DEMADEX) 20 MG tablet Take 40 mg by mouth every morning. 04/27/20  Yes [provider]  vitamin B-12 (CYANOCOBALAMIN) 1000 MCG tablet Take 1,000 mcg by mouth every morning.   Yes [provider]    Physical Exam: Vitals:   05/16/20 0230 05/16/20 0245 05/16/20 0300 05/16/20 0315  BP: (!) 144/96  (!) 158/86   Pulse: 68 69 68 70  Resp: (!) 24 20 18  (!) 22  Temp:      TempSrc:      SpO2: (!) 89% 90% 94% 90%    Constitutional: NAD, calm, comfortable Vitals:   05/16/20 0230 05/16/20 0245 05/16/20 0300 05/16/20 0315  BP: (!) 144/96  (!) 158/86   Pulse: 68 69 68 70  Resp: (!) 24 20 18  (!) 22  Temp:      TempSrc:      SpO2: (!) 89% 90% 94% 90%   General: WDWN, Alert and oriented to person and place Eyes: EOMI, PERRL, lids and conjunctivae normal.  Sclera nonicteric HENT:  [place], small hematoma on right posterior head.  External ears normal.  Nares patent without epistasis.  Mucous membranes are moist. Posterior pharynx clear of any exudate or lesions. Neck: Soft, normal range of motion, supple, no masses, no thyromegaly.  Trachea midline Respiratory: clear to auscultation bilaterally, no wheezing, no crackles. Normal  respiratory effort. No accessory muscle use.  Cardiovascular: Irregularly irregular rhythm with normal rate., no murmurs / rubs / gallops. No extremity edema. 1+ pedal pulses. No carotid bruits.  Abdomen: Soft, no tenderness, nondistended, no rebound or guarding.  No masses palpated. No hepatosplenomegaly. Bowel sounds normoactive Musculoskeletal: FROM. no clubbing / cyanosis. No joint deformity upper and lower extremities. Normal muscle tone.  Skin: Warm, dry, intact no rashes, lesions, ulcers. No induration Neurologic: CN 2-12 grossly intact.  Normal speech.  Sensation intact, patella DTR +1 bilaterally. Strength 4/5 in all extremities.   Psychiatric: Normal judgment and insight.  Normal mood.    Labs on Admission: I have personally reviewed following labs and imaging studies  CBC: Recent Labs  Lab 05/15/20 1944  WBC 8.4  NEUTROABS 6.5  HGB 10.8*  HCT 34.4*  MCV 97.2  PLT 168    Basic Metabolic Panel: Recent Labs  Lab 05/15/20 1944  NA 138  K 4.1  CL 104  CO2 24  GLUCOSE 122*  BUN 13  CREATININE  1.93*  CALCIUM 9.3    GFR: CrCl cannot be calculated (Unknown ideal weight.).  Liver Function Tests: Recent Labs  Lab 05/15/20 1944  AST 40  ALT 15  ALKPHOS 127*  BILITOT 1.2  PROT 7.1  ALBUMIN 4.0    Urine analysis:    Component Value Date/Time   COLORURINE STRAW (A) 05/16/2020 0040   APPEARANCEUR CLEAR 05/16/2020 0040   LABSPEC 1.004 (L) 05/16/2020 0040   PHURINE 7.0 05/16/2020 0040   GLUCOSEU NEGATIVE 05/16/2020 0040   HGBUR NEGATIVE 05/16/2020 0040   BILIRUBINUR NEGATIVE 05/16/2020 0040   KETONESUR NEGATIVE 05/16/2020 0040   PROTEINUR NEGATIVE 05/16/2020 0040   NITRITE NEGATIVE 05/16/2020 0040   LEUKOCYTESUR MODERATE (A) 05/16/2020 0040    Radiological Exams on Admission: DG Chest 2 View  Result Date: 05/15/2020 CLINICAL DATA:  Loss of consciousness, syncope EXAM: CHEST - 2 VIEW COMPARISON:  07/06/2018 FINDINGS: Frontal and lateral views of the  chest demonstrate a stable enlarged cardiac silhouette. Continued ectasia of the thoracic aorta. There is diffuse interstitial prominence, with small bilateral pleural effusions. No pneumothorax. IMPRESSION: 1. Mild interstitial edema. Electronically Signed   By: Sharlet Salina M.D.   On: 05/15/2020 19:35   CT Head Wo Contrast  Result Date: 05/15/2020 CLINICAL DATA:  Syncope.  Hematoma to posterior head. EXAM: CT HEAD WITHOUT CONTRAST TECHNIQUE: Contiguous axial images were obtained from the base of the skull through the vertex without intravenous contrast. COMPARISON:  07/06/2018 FINDINGS: Brain: No evidence of acute infarction, hemorrhage, hydrocephalus, extra-axial collection or mass lesion/mass effect. There is mild diffuse low-attenuation within the subcortical and periventricular white matter compatible with chronic microvascular disease. Vascular: No hyperdense vessel or unexpected calcification. Skull: Normal. Negative for fracture or focal lesion. Sinuses/Orbits: No acute finding. Other: Right posterior scalp laceration and hematoma measures 0.9 x 3.6 cm, image 11/3 IMPRESSION: 1. No acute intracranial abnormalities. 2. Chronic small vessel ischemic disease and brain atrophy. 3. Right posterior scalp laceration and hematoma. Electronically Signed   By: Signa Kell M.D.   On: 05/15/2020 19:21    EKG: Independently reviewed.  EKG is reviewed.  Atrial fibrillation with occasional PVCs.  No acute ST elevation or depression.  QTc prolonged at 497  Assessment/Plan Principal Problem:   NSTEMI (non-ST elevated myocardial infarction) [facility]) Patient replaced on cardiac telemetry for observation for elevated troponin and NSTEMI.  Obtain serial troponin levels. Will need cardiology consult in am.  Antiplatelet therapy with aspirin daily.  Continue statin therapy.  Monitor blood pressure.  Nitroglycerin as needed.  Pulmonal oxygen as needed to maintain O2 sat between 92-96%  Active Problems:    Syncope Check orthostatic blood pressures upon arrival to floor. Syncopal episode when he stood up after going to the bathroom.  Possibly secondary to NSTEMI versus vasovagal    Atrial fibrillation, chronic (HCC) Patient has atrial fibrillation and is not anticoagulated secondary to multiple falls and syncopal events in past    Essential hypertension Continue metoprolol.  Monitor blood pressure    CKD (chronic kidney disease) stage 3, GFR 30-59 ml/min IV fluid hydration with LR at 50 mils per hour overnight.  Recheck electrolytes renal function morning.    DVT prophylaxis: Padua score elevated.  Heparin for DVT prophylaxis Code Status:   Full code Family Communication:  Diagnosed plan discussed with patient.  Further recommendations to follow as clinical indicated.  No family at bedside Disposition Plan:   Patient is from:  Home  Anticipated DC to:  Home  Anticipated DC date:  Anticipate 2 midnight or less stay in the hospital  Anticipated DC barriers: Barriers to discharge identified this time   Admission status:  Observation  Severity of Illness: The appropriate patient status for this patient is OBSERVATION. Observation status is judged to be reasonable and necessary in order to provide the required intensity of service to ensure the patient's safety. The patient's presenting symptoms, physical exam findings, and initial radiographic and laboratory data in the context of their medical condition is felt to place them at decreased risk for further clinical deterioration. Furthermore, it is anticipated that the patient will be medically stable for discharge from the hospital within 2 midnights of admission. The following factors support the patient status of observation.    Claudean Severance Slayter Moorhouse MD [facility]  How to contact the [facility] Attending or Consulting provider 7A - 7P or covering provider during after hours 7P -7A, for this patient?   1. Check the care team in [facility] and  look for a) attending/consulting [facility] provider listed and b) the [facility] team listed 2. Log into [email or web address] and use [place]'s universal password to access. If you do not have the password, please contact the hospital operator. 3. Locate the [facility] provider you are looking for under [facility] and page to a number that you can be directly reached. 4. If you still have difficulty reaching the provider, please page the [facility] (Director on Call) for the Hospitalists listed on amion for assistance.  05/16/2020, 3:55 AM

## 2020-05-16 NOTE — Evaluation (Signed)
Physical Therapy Evaluation Patient Details Name: Donald Arellano MRN: [id number] DOB: 03-17-1932 Today's Date: 05/16/2020   History of Present Illness   86 y.o. male with medical history significant for atrial fibrillation not on anticoagulation, CKD, mild dementia, hypertension who presents after syncopal event at home.  Reports he went to the bathroom and after having a bowel movement he stood up and felt very lightheaded so he sat back down the toilet and the next thing he knows he woke up on the floor.    Clinical Impression  Pt admitted secondary to problem above with deficits below. Pt requiring min A for bed mobility and min guard A for transfers and gait using RW. Cognitive deficits noted, however, is likely close to baseline. Anticipate pt will progress well and will not require follow up PT services. Will continue to follow acutely to maximize functional mobility independence and safety.     Follow Up Recommendations No PT follow up;Supervision for mobility/OOB    Equipment Recommendations  None recommended by PT    Recommendations for Other Services       Precautions / Restrictions Precautions Precautions: Fall Restrictions Weight Bearing Restrictions: No      Mobility  Bed Mobility Overal bed mobility: Needs Assistance Bed Mobility: Supine to Sit;Sit to Supine     Supine to sit: Min assist Sit to supine: Supervision   General bed mobility comments: Min A for trunk elevation and assist with scooting forwards. Supervision for return to supine.   Transfers Overall transfer level: Needs assistance Equipment used: Rolling walker (2 wheeled) Transfers: Sit to/from Stand Sit to Stand: Min guard         General transfer comment: Min guard for safety to stand from higher stretcher height. Pt asymptomatic with transition.   Ambulation/Gait Ambulation/Gait assistance: Min guard Gait Distance (Feet): 50 Feet Assistive device: Rolling walker (2 wheeled) Gait  Pattern/deviations: Step-through pattern Gait velocity: Decreased   General Gait Details: Mild unsteadiness, but no overt LOB noted with use of RW. Slower pace. Pt reports being close to baseline. Asymptomatic throughout.   Stairs            Wheelchair Mobility    Modified Rankin (Stroke Patients Only)       Balance Overall balance assessment: Needs assistance Sitting-balance support: No upper extremity supported;Feet supported Sitting balance-Leahy Scale: Good     Standing balance support: Bilateral upper extremity supported;During functional activity Standing balance-Leahy Scale: Poor Standing balance comment: Reliant on BUE support                              Pertinent Vitals/Pain Pain Assessment: No/denies pain    Home Living Family/patient expects to be discharged to:: Private residence Living Arrangements: Other relatives (niece and her husband ) Available Help at Discharge: Family;Available 24 hours/day Type of Home: House Home Access: Ramped entrance     Home Layout: One level Home Equipment: Walker - 2 wheels;Bedside commode;Shower seat;Wheelchair - manual;Cane - single point;Grab bars - tub/shower;Grab bars - toilet      Prior Function Level of Independence: Independent with assistive device(s)         Comments: Uses RW for ambulation      Hand Dominance        Extremity/Trunk Assessment   Upper Extremity Assessment Upper Extremity Assessment: Defer to OT evaluation    Lower Extremity Assessment Lower Extremity Assessment: Overall WFL for tasks assessed    Cervical / Trunk  Assessment Cervical / Trunk Assessment: Other exceptions (forward head)  Communication   Communication: HOH  Cognition Arousal/Alertness: Awake/alert Behavior During Therapy: WFL for tasks assessed/performed Overall Cognitive Status: No family/caregiver present to determine baseline cognitive functioning                                  General Comments: hx of dementia      General Comments      Exercises     Assessment/Plan    PT Assessment Patient needs continued PT services  PT Problem List Decreased strength;Decreased balance;Decreased mobility;Decreased cognition       PT Treatment Interventions Gait training;DME instruction;Functional mobility training;Therapeutic activities;Therapeutic exercise;Balance training;Cognitive remediation    PT Goals (Current goals can be found in the Care Plan section)  Acute Rehab PT Goals Patient Stated Goal: to go home PT Goal Formulation: With patient Time For Goal Achievement: 05/30/20 Potential to Achieve Goals: Good    Frequency Min 3X/week   Barriers to discharge        Co-evaluation               AM-PAC PT "6 Clicks" Mobility  Outcome Measure Help needed turning from your back to your side while in a flat bed without using bedrails?: A Little Help needed moving from lying on your back to sitting on the side of a flat bed without using bedrails?: A Little Help needed moving to and from a bed to a chair (including a wheelchair)?: A Little Help needed standing up from a chair using your arms (e.g., wheelchair or bedside chair)?: A Little Help needed to walk in hospital room?: A Little Help needed climbing 3-5 steps with a railing? : A Lot 6 Click Score: 17    End of Session Equipment Utilized During Treatment: Gait belt Activity Tolerance: Patient tolerated treatment well Patient left: in bed;with call bell/phone within reach (on stretcher in ED ) Nurse Communication: Mobility status PT Visit Diagnosis: Muscle weakness (generalized) (M62.81);Unsteadiness on feet (R26.81)    Time: 6629-4765 PT Time Calculation (min) (ACUTE ONLY): 14 min   Charges:   PT Evaluation $PT Eval Low Complexity: 1 Low          Cindee Salt, DPT  Acute Rehabilitation Services  Pager: [phone] Office: [phone]   [place] [place] 05/16/2020,  3:03 PM

## 2020-05-17 ENCOUNTER — Observation Stay (HOSPITAL_COMMUNITY): Payer: Medicare Other

## 2020-05-17 DIAGNOSIS — I214 Non-ST elevation (NSTEMI) myocardial infarction: Secondary | ICD-10-CM | POA: Diagnosis not present

## 2020-05-17 DIAGNOSIS — J441 Chronic obstructive pulmonary disease with (acute) exacerbation: Secondary | ICD-10-CM

## 2020-05-17 LAB — BASIC METABOLIC PANEL
Anion gap: 10 (ref 5–15)
BUN: 18 mg/dL (ref 8–23)
CO2: 24 mmol/L (ref 22–32)
Calcium: 9 mg/dL (ref 8.9–10.3)
Chloride: 101 mmol/L (ref 98–111)
Creatinine, Ser: 1.76 mg/dL — ABNORMAL HIGH (ref 0.61–1.24)
GFR calc Af Amer: 40 mL/min — ABNORMAL LOW (ref >60)
GFR calc non Af Amer: 34 mL/min — ABNORMAL LOW (ref >60)
Glucose, Bld: 98 mg/dL (ref 70–99)
Potassium: 3.6 mmol/L (ref 3.5–5.1)
Sodium: 135 mmol/L (ref 135–145)

## 2020-05-17 MED ORDER — FUROSEMIDE 10 MG/ML IJ SOLN
40.0000 mg | Freq: Once | INTRAMUSCULAR | Status: AC
  Administered 2020-05-17: 40 mg via INTRAVENOUS
  Filled 2020-05-17: qty 4

## 2020-05-17 NOTE — Care Management Obs Status (Signed)
MEDICARE OBSERVATION STATUS NOTIFICATION   Patient Details  Name: Donald Arellano MRN: [id number] Date of Birth: 13-Sep-1931   Medicare Observation Status Notification Given:  Yes    Nance Pear, RN 05/17/2020, 10:57 AM

## 2020-05-17 NOTE — Progress Notes (Signed)
Physical Therapy Treatment Patient Details Name: Donald Arellano MRN: [id number] DOB: 05/16/1932 Today's Date: 05/17/2020    History of Present Illness  86 y.o. male with medical history significant for atrial fibrillation not on anticoagulation, CKD, mild dementia, hypertension who presents after syncopal event at home.  Reports he went to the bathroom and after having a bowel movement he stood up and felt very lightheaded so he sat back down the toilet and the next thing he knows he woke up on the floor.      PT Comments    Met pt standing with RN and MD attempting to get reliable walking SaO2. Having difficulty with monitors and sensors, however walking SaO2 likely dropped to 87%O2 on 2L O2 via [place]. With return to room and changing out sensor SaO2 on 2L O2 via [place] 97%O2. Pt is limited in safe mobility by decreased strength and endurance. Pt is min guard for ambulation of 25 feet back to room. Pt able to participate in seated therapeutic exercises, noted to have good muscle mass in both LE. When asked if he does exercise at home pt answers yes and stands up in RW to show heel raises and hip AB/AD. Afterward pt reports he is tired and returns to bed with supervision. SaO2 during exercise 89-92%O2. D/c plans remain appropriate at this time. PT will continue to follow acutely.      Follow Up Recommendations  No PT follow up;Supervision for mobility/OOB     Equipment Recommendations  None recommended by PT       Precautions / Restrictions Precautions Precautions: Fall Restrictions Weight Bearing Restrictions: No    Mobility  Bed Mobility Overal bed mobility: Needs Assistance Bed Mobility: Supine to Sit;Sit to Supine       Sit to supine: Supervision   General bed mobility comments: supervision to return to bed, minor use of bedrails  Transfers Overall transfer level: Needs assistance Equipment used: Rolling walker (2 wheeled) Transfers: Sit to/from Stand Sit to Stand: Min guard          General transfer comment: Min guard for safety, able to power up before reaching to RW  Ambulation/Gait Ambulation/Gait assistance: Min guard Gait Distance (Feet): 25 Feet (2x25' standing break for SaO2 monitoring) Assistive device: Rolling walker (2 wheeled) Gait Pattern/deviations: Step-through pattern Gait velocity: Decreased   General Gait Details: slow, mildly unsteady gait, no overt loss of balance       Balance Overall balance assessment: Needs assistance Sitting-balance support: No upper extremity supported;Feet supported Sitting balance-Leahy Scale: Good     Standing balance support: Bilateral upper extremity supported;During functional activity Standing balance-Leahy Scale: Fair Standing balance comment: able to static stand without UE support, needs bilateral UE support for standing therex                            Cognition Arousal/Alertness: Awake/alert Behavior During Therapy: WFL for tasks assessed/performed Overall Cognitive Status: No family/caregiver present to determine baseline cognitive functioning                                 General Comments: hx of dementia      Exercises General Exercises - Lower Extremity Long Arc Quad: AROM;Both;10 reps;Seated Hip ABduction/ADduction: AROM;Both;10 reps;Standing Hip Flexion/Marching: AROM;Both;10 reps;Seated Heel Raises: AROM;Both;10 reps;Standing    General Comments General comments (skin integrity, edema, etc.): BP 119/66 after ambulation, difficulty with getting  good pleth form with multiple monitors and sensors. SaO2 on 2L O2 via [place] likely dropped to 87%O2 with ambulation and with standing exercises, with return to supine in bed SaO2 97%O2 on 2L O2 via [place]       Pertinent Vitals/Pain Pain Assessment: No/denies pain           PT Goals (current goals can now be found in the care plan section) Acute Rehab PT Goals Patient Stated Goal: to go home PT Goal Formulation:  With patient Time For Goal Achievement: 05/30/20 Potential to Achieve Goals: Good Progress towards PT goals: Progressing toward goals    Frequency    Min 3X/week      PT Plan Current plan remains appropriate       AM-PAC PT "6 Clicks" Mobility   Outcome Measure  Help needed turning from your back to your side while in a flat bed without using bedrails?: A Little Help needed moving from lying on your back to sitting on the side of a flat bed without using bedrails?: A Little Help needed moving to and from a bed to a chair (including a wheelchair)?: A Little Help needed standing up from a chair using your arms (e.g., wheelchair or bedside chair)?: A Little Help needed to walk in hospital room?: A Little Help needed climbing 3-5 steps with a railing? : A Lot 6 Click Score: 17    End of Session Equipment Utilized During Treatment: Gait belt Activity Tolerance: Patient tolerated treatment well Patient left: in bed;with call bell/phone within reach (on stretcher in ED ) Nurse Communication: Mobility status PT Visit Diagnosis: Muscle weakness (generalized) (M62.81);Unsteadiness on feet (R26.81)     Time: 7591-6384 PT Time Calculation (min) (ACUTE ONLY): 23 min  Charges:  $Therapeutic Exercise: 23-37 mins                     Safire Gordin B. Migdalia Dk PT, DPT Acute Rehabilitation Services Pager [phone] Office [phone]    [place] 05/17/2020, 11:55 AM

## 2020-05-17 NOTE — Progress Notes (Addendum)
PROGRESS NOTE    Donald Arellano  SFK:[id number] DOB: Dec 15, 1931 DOA: 05/15/2020 PCP: Mare Ferrari, MD   Brief Narrative:  Donald Arellano is a 86 y.o. male with medical history significant for atrial fibrillation not on anticoagulation, CKD, mild dementia, hypertension and previously undocumented COPD who presents after syncopal event at home.  Reports he went to the bathroom and after having a bowel movement he stood up and felt very lightheaded so he sat back down the toilet and the next thing he knows he woke up on the floor.  His niece who lives with found him on the floor.  No report of any seizure activity.  No family is at bedside.  Patient denies having any chest pain or palpitations prior to the event.  He states that when he stood up he felt lightheaded but was not dizzy and just had a feeling of generalized weakness.  He denies any shortness of breath, diaphoresis, nausea, vomiting.  He has had syncopal events in the past.  He is not on anticoagulation with his chronic atrial fibrillation secondary to multiple falls at home.  At this time he states that he feels in his normal state of health and has no complaints. EKG shows atrial fibrillation with prolonged QT interval but no acute ST elevation or depression.   Assessment & Plan:   Principal Problem:   NSTEMI (non-ST elevated myocardial infarction) [facility]) Active Problems:   Syncope   Atrial fibrillation, chronic (HCC)   Essential hypertension   CKD (chronic kidney disease) stage 3, GFR 30-59 ml/min   COPD with acute exacerbation (HCC)   Syncope, likely orthostatic, POA Check orthostatic blood pressures upon arrival to floor - pending Syncopal episode after he stood up PT/OT to follow  NSTEMI (non-ST elevated myocardial infarction) (HCC) Troponin minimally down trending, no longer following Likely secondary to transient low BP (supply demand mismatch - type 2)  Acute on questionably chronic hypoxia, likely multifactorial,  POA Questionable interstitial edema noted on imaging at admission Possibly worsening baseline COPD vs acute exacerbation, POA Patient noted to be hypoxic overnight CXR shows moderate interstitial edema - lasix IV x1 Continue nebs PRN  Atrial fibrillation, chronic (HCC) Patient has atrial fibrillation and is not anticoagulated secondary to multiple falls and syncopal events in past - high risk for bleeding events  Essential hypertension Continue metoprolol.  Monitor blood pressure  CKD (chronic kidney disease) stage 3A Appears to be at baseline, follow labs  DVT prophylaxis: Heparin subQ Code Status:   Full code Family Communication: None available  Status is: Observation  Dispo: The patient is from: Home              Anticipated d/c is to: Home              Anticipated d/c date is: 24-48h              Patient currently not medically stable for discharge due to ongoing need for evaluation and workup given acute hypoxia, repeat labs and physical therapy.  Consultants:   None  Procedures:   None  Antimicrobials:  None indicated   Subjective: Noted to be hypoxic overnight - still requiring oxygen with ambulation today. Patient feels well otherwise and denies shortness of breath, headache, chest pain, fevers or chills.  Objective: Vitals:   05/16/20 2350 05/17/20 0411 05/17/20 0837 05/17/20 1212  BP:   132/72 134/81  Pulse:   66 70  Resp:   18 16  Temp: 98.7 F (37.1  C) 97.9 F (36.6 C) 98.4 F (36.9 C) 98.4 F (36.9 C)  TempSrc: Oral Oral Oral Oral  SpO2:   97% 91%  Weight:      Height:        Intake/Output Summary (Last 24 hours) at 05/17/2020 1344 Last data filed at 05/17/2020 0300 Gross per 24 hour  Intake 1180.19 ml  Output 500 ml  Net 680.19 ml   Filed Weights   05/16/20 1618  Weight: 75.9 kg    Examination:  General exam: Appears calm and comfortable  Respiratory system: Scant bibasilar rales. Cardiovascular system: S1 & S2 heard, RRR. No  JVD, murmurs, rubs, gallops or clicks. No pedal edema. Gastrointestinal system: Abdomen is nondistended, soft and nontender. No organomegaly or masses felt. Normal bowel sounds heard. Central nervous system: Alert and oriented. No focal neurological deficits. Extremities: Symmetric 5 x 5 power. Skin: No rashes, lesions or ulcers  Data Reviewed: I have personally reviewed following labs and imaging studies  CBC: Recent Labs  Lab 05/15/20 1944 05/16/20 0433  WBC 8.4 7.4  NEUTROABS 6.5  --   HGB 10.8* 10.4*  HCT 34.4* 32.5*  MCV 97.2 95.0  PLT 168 146*   Basic Metabolic Panel: Recent Labs  Lab 05/15/20 1944 05/16/20 0433 05/17/20 0829  NA 138 137 135  K 4.1 3.6 3.6  CL 104 105 101  CO2 24 20* 24  GLUCOSE 122* 101* 98  BUN CREATININE 1.93* 1.90* 1.76*  CALCIUM 9.3 8.8* 9.0   GFR: Estimated Creatinine Clearance: 27.2 mL/min (A) (by C-G formula based on SCr of 1.76 mg/dL (H)). Liver Function Tests: Recent Labs  Lab 05/15/20 1944  AST 40  ALT 15  ALKPHOS 127*  BILITOT 1.2  PROT 7.1  ALBUMIN 4.0   No results for input(s): LIPASE, AMYLASE in the last 168 hours. No results for input(s): AMMONIA in the last 168 hours. Coagulation Profile: No results for input(s): INR, PROTIME in the last 168 hours. Cardiac Enzymes: No results for input(s): CKTOTAL, CKMB, CKMBINDEX, TROPONINI in the last 168 hours. BNP (last 3 results) No results for input(s): PROBNP in the last 8760 hours. HbA1C: No results for input(s): HGBA1C in the last 72 hours. CBG: No results for input(s): GLUCAP in the last 168 hours. Lipid Profile: No results for input(s): CHOL, HDL, LDLCALC, TRIG, CHOLHDL, LDLDIRECT in the last 72 hours. Thyroid Function Tests: No results for input(s): TSH, T4TOTAL, FREET4, T3FREE, THYROIDAB in the last 72 hours. Anemia Panel: No results for input(s): VITAMINB12, FOLATE, FERRITIN, TIBC, IRON, RETICCTPCT in the last 72 hours. Sepsis Labs: No results for  input(s): PROCALCITON, LATICACIDVEN in the last 168 hours.  Recent Results (from the past 240 hour(s))  SARS Coronavirus 2 by RT PCR (hospital order, performed in [facility] hospital lab) Nasopharyngeal Nasopharyngeal Swab     Status: None   Collection Time: 05/16/20  3:11 AM   Specimen: Nasopharyngeal Swab  Result Value Ref Range Status   SARS Coronavirus 2 NEGATIVE NEGATIVE Final    Comment: (NOTE) SARS-CoV-2 target nucleic acids are NOT DETECTED.  The SARS-CoV-2 RNA is generally detectable in upper and lower respiratory specimens during the acute phase of infection. The lowest concentration of SARS-CoV-2 viral copies this assay can detect is 250 copies / mL. A negative result does not preclude SARS-CoV-2 infection and should not be used as the sole basis for treatment or other patient management decisions.  A negative result may occur with improper specimen collection / handling, submission  of specimen other than nasopharyngeal swab, presence of viral mutation(s) within the areas targeted by this assay, and inadequate number of viral copies (<250 copies / mL). A negative result must be combined with clinical observations, patient history, and epidemiological information.  Fact Sheet for Patients:   BoilerBrush.com.cy  Fact Sheet for Healthcare Providers: https://pope.com/  This test is not yet approved or  cleared by the [place] FDA and has been authorized for detection and/or diagnosis of SARS-CoV-2 by FDA under an Emergency Use Authorization (EUA).  This EUA will remain in effect (meaning this test can be used) for the duration of the COVID-19 declaration under Section 564(b)(1) of the Act, 21 U.S.C. section 360bbb-3(b)(1), unless the authorization is terminated or revoked sooner.  Performed at [facility], [address], [place], [place] [address]          Radiology Studies: DG Chest 2 View  Result  Date: 05/15/2020 CLINICAL DATA:  Loss of consciousness, syncope EXAM: CHEST - 2 VIEW COMPARISON:  07/06/2018 FINDINGS: Frontal and lateral views of the chest demonstrate a stable enlarged cardiac silhouette. Continued ectasia of the thoracic aorta. There is diffuse interstitial prominence, with small bilateral pleural effusions. No pneumothorax. IMPRESSION: 1. Mild interstitial edema. Electronically Signed   By: Sharlet Salina M.D.   On: 05/15/2020 19:35   CT Head Wo Contrast  Result Date: 05/15/2020 CLINICAL DATA:  Syncope.  Hematoma to posterior head. EXAM: CT HEAD WITHOUT CONTRAST TECHNIQUE: Contiguous axial images were obtained from the base of the skull through the vertex without intravenous contrast. COMPARISON:  07/06/2018 FINDINGS: Brain: No evidence of acute infarction, hemorrhage, hydrocephalus, extra-axial collection or mass lesion/mass effect. There is mild diffuse low-attenuation within the subcortical and periventricular white matter compatible with chronic microvascular disease. Vascular: No hyperdense vessel or unexpected calcification. Skull: Normal. Negative for fracture or focal lesion. Sinuses/Orbits: No acute finding. Other: Right posterior scalp laceration and hematoma measures 0.9 x 3.6 cm, image 11/3 IMPRESSION: 1. No acute intracranial abnormalities. 2. Chronic small vessel ischemic disease and brain atrophy. 3. Right posterior scalp laceration and hematoma. Electronically Signed   By: Signa Kell M.D.   On: 05/15/2020 19:21   DG Chest Port 1 View  Result Date: 05/17/2020 CLINICAL DATA:  Hypoxia EXAM: PORTABLE CHEST 1 VIEW COMPARISON:  May 15, 2020 FINDINGS: There is underlying fibrotic change. No edema or airspace opacity. Heart is enlarged with pulmonary vascularity normal. There is aortic atherosclerosis. No adenopathy evident. Several chronic appearing rib fractures noted on the left. There is degenerative change in each shoulder with superior migration of each humeral  head. IMPRESSION: Underlying parenchymal fibrosis, stable. No edema or airspace opacity. Stable cardiomegaly. Prior rib fractures on the left noted. Suspect chronic rotator cuff tears given superior migration of each humeral head. Aortic Atherosclerosis (ICD10-I70.0). Electronically Signed   By: Bretta Bang III M.D.   On: 05/17/2020 12:29   ECHOCARDIOGRAM COMPLETE  Result Date: 05/16/2020    ECHOCARDIOGRAM REPORT   Patient Name:   KAMIL MCHAFFIE Date of Exam: 05/16/2020 Medical Rec #:  [id number]     Height: Accession #:    [id number]    Weight: Date of Birth:  11/07/1931     BSA: Patient Age:    88 years      BP:           138/88 mmHg Patient Gender: M             HR:  57 bpm. Exam Location:  Inpatient Procedure: 2D Echo Indications:    atrial fibrillation. Elevated troponin  History:        Patient has no prior history of Echocardiogram examinations.                 Chronic kidney disease, Arrythmias:Atrial Fibrillation,                 Signs/Symptoms:Syncope; Risk Factors:Hypertension.  Sonographer:    Delcie RochLauren Pennington Referring Phys: [id number] BRADLEY S CHOTINER IMPRESSIONS  1. Left ventricular ejection fraction, by estimation, is 55 to 60%. The left ventricle has normal function. The left ventricle has no regional wall motion abnormalities. Left ventricular diastolic function could not be evaluated. There is the interventricular septum is flattened in systole and diastole, consistent with right ventricular pressure and volume overload.  2. Right ventricular systolic function is mildly reduced. The right ventricular size is severely enlarged. Mildly increased right ventricular wall thickness. There is severely elevated pulmonary artery systolic pressure.  3. Left atrial size was moderately dilated.  4. Right atrial size was severely dilated.  5. The mitral valve is normal in structure. Mild mitral valve regurgitation.  6. Tricuspid valve regurgitation is severe.  7. The aortic valve is normal in  structure. Aortic valve regurgitation is not visualized. Mild aortic valve sclerosis is present, with no evidence of aortic valve stenosis.  8. The inferior vena cava is dilated in size with >50% respiratory variability, suggesting right atrial pressure of 8 mmHg. Comparison(s): No prior Echocardiogram. FINDINGS  Left Ventricle: Left ventricular ejection fraction, by estimation, is 55 to 60%. The left ventricle has normal function. The left ventricle has no regional wall motion abnormalities. The left ventricular internal cavity size was normal in size. There is  no left ventricular hypertrophy. The interventricular septum is flattened in systole and diastole, consistent with right ventricular pressure and volume overload. Left ventricular diastolic function could not be evaluated due to atrial fibrillation. Left ventricular diastolic function could not be evaluated. Right Ventricle: The right ventricular size is severely enlarged. Mildly increased right ventricular wall thickness. Right ventricular systolic function is mildly reduced. There is severely elevated pulmonary artery systolic pressure. The tricuspid regurgitant velocity is 4.58 m/s, and with an assumed right atrial pressure of 8 mmHg, the estimated right ventricular systolic pressure is 91.9 mmHg. Left Atrium: Left atrial size was moderately dilated. Right Atrium: Right atrial size was severely dilated. Pericardium: There is no evidence of pericardial effusion. Mitral Valve: The mitral valve is normal in structure. Mild mitral valve regurgitation, with centrally-directed jet. Tricuspid Valve: The tricuspid valve is grossly normal. Tricuspid valve regurgitation is severe. Aortic Valve: The aortic valve is normal in structure. Aortic valve regurgitation is not visualized. Mild aortic valve sclerosis is present, with no evidence of aortic valve stenosis. Pulmonic Valve: The pulmonic valve was normal in structure. Pulmonic valve regurgitation is trivial.  Aorta: The aortic root and ascending aorta are structurally normal, with no evidence of dilitation. Venous: The inferior vena cava is dilated in size with greater than 50% respiratory variability, suggesting right atrial pressure of 8 mmHg. IAS/Shunts: No atrial level shunt detected by color flow Doppler.  LEFT VENTRICLE PLAX 2D LVIDd:         4.30 cm LVIDs:         3.10 cm LV PW:         1.10 cm LV IVS:        1.10 cm LVOT diam:  1.90 cm LV SV:         33 LVOT Area:     2.84 cm  RIGHT VENTRICLE            IVC RV S prime:     9.68 cm/s  IVC diam: 2.20 cm TAPSE (M-mode): 1.4 cm LEFT ATRIUM           RIGHT ATRIUM LA diam:      4.50 cm RA Area:     26.50 cm LA Vol (A4C): 74.7 ml RA Volume:   79.90 ml  AORTIC VALVE LVOT Vmax:   54.10 cm/s LVOT Vmean:  35.900 cm/s LVOT VTI:    0.115 m  AORTA Ao Root diam: 3.50 cm Ao Asc diam:  3.80 cm TRICUSPID VALVE TR Peak grad:   83.9 mmHg TR Vmax:        458.00 cm/s  SHUNTS Systemic VTI:  0.12 m Systemic Diam: 1.90 cm Rachelle Hora Croitoru MD Electronically signed by Thurmon Fair MD Signature Date/Time: 05/16/2020/4:16:51 PM    Final     Scheduled Meds: . aspirin EC  81 mg Oral Daily  . atorvastatin  20 mg Oral Q supper  . donepezil  10 mg Oral Q supper  . heparin  5,000 Units Subcutaneous Q8H  . metoprolol succinate  25 mg Oral q morning - 10a  . sertraline  75 mg Oral q morning - 10a  . tamsulosin  0.4 mg Oral q morning - 10a  . torsemide  40 mg Oral q morning - 10a   Continuous Infusions: . lactated ringers 50 mL/hr at 05/17/20 0506     LOS: 0 days   sTime spent:  Azucena Fallen, DO [facility]  If 7PM-7AM, please contact night-coverage [email or web address]  05/17/2020, 1:44 PM

## 2020-05-18 DIAGNOSIS — I214 Non-ST elevation (NSTEMI) myocardial infarction: Secondary | ICD-10-CM | POA: Diagnosis not present

## 2020-05-18 LAB — BASIC METABOLIC PANEL
Anion gap: 12 (ref 5–15)
BUN: 26 mg/dL — ABNORMAL HIGH (ref 8–23)
CO2: 23 mmol/L (ref 22–32)
Calcium: 9 mg/dL (ref 8.9–10.3)
Chloride: 100 mmol/L (ref 98–111)
Creatinine, Ser: 1.82 mg/dL — ABNORMAL HIGH (ref 0.61–1.24)
GFR calc Af Amer: 38 mL/min — ABNORMAL LOW (ref >60)
GFR calc non Af Amer: 33 mL/min — ABNORMAL LOW (ref >60)
Glucose, Bld: 93 mg/dL (ref 70–99)
Potassium: 3.3 mmol/L — ABNORMAL LOW (ref 3.5–5.1)
Sodium: 135 mmol/L (ref 135–145)

## 2020-05-18 NOTE — TOC Transition Note (Signed)
Transition of Care [facility]) - CM/SW Discharge Note   Patient Details  Name: GASPAR FOWLE MRN: [id number] Date of Birth: [birth year]  Transition of Care [facility]) CM/SW Contact:  Nance Pear, RN Phone Number: 05/18/2020, 12:44 PM   Clinical Narrative:   Case manager spoke to patient about home oxygen.  Patient does not have a preference on agency to use for oxygen.  Case manager called [facility] and spoke to [facility], ordered oxygen for patient which will be delivered to room.  Niece will provide transportation to home.    Final next level of care: Home/Self Care Barriers to Discharge: No Barriers Identified   Patient Goals and CMS Choice        Discharge Placement                       Discharge Plan and Services     Post Acute Care Choice: Durable Medical Equipment, Home Health          DME Arranged: Oxygen DME Agency: [employer] Date DME Agency Contacted: 05/18/20 Time DME Agency Contacted: [phone] Representative spoke with at DME Agency: Ian Malkin            Social Determinants of Health (SDOH) Interventions     Readmission Risk Interventions No flowsheet data found.

## 2020-05-18 NOTE — Progress Notes (Signed)
SATURATION QUALIFICATIONS: (This note is used to comply with regulatory documentation for home oxygen)  Patient Saturations on Room Air at Rest = 86%  Patient Saturations on Room Air while Ambulating = 87%  Patient Saturations on 2 Liters of oxygen while Ambulating = 97%  Please briefly explain why patient needs home oxygen:

## 2020-05-18 NOTE — Progress Notes (Signed)
Checked oxygen saturation with ambulation in the hallway. Ambulated with front wheel walker x 50 feet on RA and with O2 @ 2L via [place]. Tolerated fair with one rest break. Dr Natale Milch notified. Okay to d/c with home dme for oxygen. CM notified.

## 2020-05-18 NOTE — Plan of Care (Signed)

## 2020-05-18 NOTE — Discharge Summary (Signed)
Physician Discharge Summary  Donald Arellano UMP:[id number] DOB: 07-09-1932 DOA: 05/15/2020  PCP: Mare Ferrari, MD  Admit date: 05/15/2020 Discharge date: 05/18/2020  Admitted From: Home Disposition: Home  Recommendations for Outpatient Follow-up:  1. Follow up with PCP in 1-2 weeks 2. Please obtain BMP/CBC with PCP  Home Health: None Equipment/Devices: Oxygen  Discharge Condition: Stable CODE STATUS: Full Diet recommendation: Low-fat low-salt diet  Brief/Interim Summary: Donald Arellano a 86 y.o.malewith medical history significant foratrial fibrillation not on anticoagulation,CKD, mild dementia, hypertension and previously undocumented COPD who presents after syncopal event at home. Reports he went to the bathroom and after having a bowel movement he stood up and felt very lightheaded so he sat back down the toilet and the next thing he knows he woke up on the floor. His niece who lives with found him on the floor. No report of any seizure activity. No family is at bedside. Patient denies having any chest pain or palpitations prior to the event. He states that when he stood up he felt lightheaded but was not dizzy and just had a feeling of generalized weakness. He denies any shortness of breath, diaphoresis, nausea, vomiting. He has had syncopal events in the past. He is not on anticoagulation with his chronic atrial fibrillation secondary to multiple falls at home.At this time he states that he feels in his normal state of health and has no complaints. EKG shows atrial fibrillation with prolonged QT interval but no acute ST elevation or depression.  Patient as above with acute syncopal event, appears to be somewhat ongoing per discussion with family, patient explains he has had worsening dyspnea with exertion over the past few months and family thinks this is related to his near syncope and falls.  Of note patient did have positive orthostatic vital signs as well as minimally  elevated troponin likely in the setting of hypoxia with exertion, he does have notable COPD documented at [facility] which was not known to our system until recently.  Family indicates he has never had to the best of their knowledge a walking oxygen screen, given patient's hypoxia with ambulation, this is likely a chronic condition given his COPD and would explain his dyspnea with exertion, easy fatigue as well as near syncopal events with exertion over the past few months.  Patient be discharged on supplemental oxygen nasal cannula around-the-clock.  He will need close follow-up with PCP and pulmonology in the next 1 to 2 weeks for medication and oxygen titration and further evaluation.   Discharge Diagnoses:  Principal Problem:   NSTEMI (non-ST elevated myocardial infarction) [facility]) Active Problems:   Syncope   Atrial fibrillation, chronic (HCC)   Essential hypertension   CKD (chronic kidney disease) stage 3, GFR 30-59 ml/min   COPD with acute exacerbation [facility])    Discharge Instructions  Discharge Instructions    Call MD for:  difficulty breathing, headache or visual disturbances   Complete by: As directed    Call MD for:  persistant dizziness or light-headedness   Complete by: As directed    Call MD for:  temperature >100.4   Complete by: As directed    Diet - low sodium heart healthy   Complete by: As directed    Increase activity slowly   Complete by: As directed    Increase activity slowly   Complete by: As directed    No wound care   Complete by: As directed    No wound care   Complete by: As  directed      Allergies as of 05/18/2020      Reactions   Meloxicam Swelling, Other (See Comments)   "Made me drunk"      Medication List    TAKE these medications   albuterol 108 (90 Base) MCG/ACT inhaler Commonly known as: VENTOLIN HFA Inhale 2 puffs into the lungs every 4 (four) hours as needed for wheezing or shortness of breath.   aspirin EC 81 MG tablet Take 81 mg  by mouth every morning. Swallow whole.   atorvastatin 20 MG tablet Commonly known as: LIPITOR Take 20 mg by mouth daily with supper.   cetirizine 10 MG tablet Commonly known as: ZYRTEC Take 10 mg by mouth daily with supper.   donepezil 10 MG tablet Commonly known as: ARICEPT Take 10 mg by mouth daily with supper.   metoprolol succinate 50 MG 24 hr tablet Commonly known as: TOPROL-XL Take 25 mg by mouth every morning.   multivitamin with minerals Tabs tablet Take 1 tablet by mouth daily with supper.   potassium chloride 10 MEQ CR capsule Commonly known as: MICRO-K Take 10 mEq by mouth every morning.   sertraline 50 MG tablet Commonly known as: ZOLOFT Take 75 mg by mouth every morning.   tamsulosin 0.4 MG Caps capsule Commonly known as: FLOMAX Take 0.4 mg by mouth every morning.   torsemide 20 MG tablet Commonly known as: DEMADEX Take 40 mg by mouth every morning.   vitamin B-12 1000 MCG tablet Commonly known as: CYANOCOBALAMIN Take 1,000 mcg by mouth every morning.            Durable Medical Equipment  (From admission, onward)         Start     Ordered   05/18/20 1155  DME Oxygen  Once       Question Answer Comment  Length of Need Lifetime   Mode or (Route) Nasal cannula   Liters per Minute 2   Frequency Continuous (stationary and portable oxygen unit needed)   Oxygen conserving device No   Oxygen delivery system Gas      05/18/20 1205          Follow-up Information    Schedule an appointment as soon as possible for a visit  with Mare FerrariHsieh, Stephen, MD.   Specialty: Internal Medicine Contact information: [phone] [place] [id number] [phone]              Allergies  Allergen Reactions  . Meloxicam Swelling and Other (See Comments)    "Made me drunk"     Consultations: None  Procedures/Studies: DG Chest 2 View  Result Date: 05/15/2020 CLINICAL DATA:  Loss of consciousness, syncope EXAM: CHEST - 2 VIEW COMPARISON:   07/06/2018 FINDINGS: Frontal and lateral views of the chest demonstrate a stable enlarged cardiac silhouette. Continued ectasia of the thoracic aorta. There is diffuse interstitial prominence, with small bilateral pleural effusions. No pneumothorax. IMPRESSION: 1. Mild interstitial edema. Electronically Signed   By: Sharlet SalinaMichael  Brown M.D.   On: 05/15/2020 19:35   CT Head Wo Contrast  Result Date: 05/15/2020 CLINICAL DATA:  Syncope.  Hematoma to posterior head. EXAM: CT HEAD WITHOUT CONTRAST TECHNIQUE: Contiguous axial images were obtained from the base of the skull through the vertex without intravenous contrast. COMPARISON:  07/06/2018 FINDINGS: Brain: No evidence of acute infarction, hemorrhage, hydrocephalus, extra-axial collection or mass lesion/mass effect. There is mild diffuse low-attenuation within the subcortical and periventricular white matter compatible with chronic microvascular disease.  Vascular: No hyperdense vessel or unexpected calcification. Skull: Normal. Negative for fracture or focal lesion. Sinuses/Orbits: No acute finding. Other: Right posterior scalp laceration and hematoma measures 0.9 x 3.6 cm, image 11/3 IMPRESSION: 1. No acute intracranial abnormalities. 2. Chronic small vessel ischemic disease and brain atrophy. 3. Right posterior scalp laceration and hematoma. Electronically Signed   By: Signa Kell M.D.   On: 05/15/2020 19:21   DG Chest Port 1 View  Result Date: 05/17/2020 CLINICAL DATA:  Hypoxia EXAM: PORTABLE CHEST 1 VIEW COMPARISON:  May 15, 2020 FINDINGS: There is underlying fibrotic change. No edema or airspace opacity. Heart is enlarged with pulmonary vascularity normal. There is aortic atherosclerosis. No adenopathy evident. Several chronic appearing rib fractures noted on the left. There is degenerative change in each shoulder with superior migration of each humeral head. IMPRESSION: Underlying parenchymal fibrosis, stable. No edema or airspace opacity. Stable  cardiomegaly. Prior rib fractures on the left noted. Suspect chronic rotator cuff tears given superior migration of each humeral head. Aortic Atherosclerosis (ICD10-I70.0). Electronically Signed   By: Bretta Bang III M.D.   On: 05/17/2020 12:29   ECHOCARDIOGRAM COMPLETE  Result Date: 05/16/2020    ECHOCARDIOGRAM REPORT   Patient Name:   DEVEION DENZ Date of Exam: 05/16/2020 Medical Rec #:  [id number]     Height: Accession #:    [id number]    Weight: Date of Birth:  22-Jul-1932     BSA: Patient Age:    86 years      BP:           138/88 mmHg Patient Gender: M             HR:           57 bpm. Exam Location:  Inpatient Procedure: 2D Echo Indications:    atrial fibrillation. Elevated troponin  History:        Patient has no prior history of Echocardiogram examinations.                 Chronic kidney disease, Arrythmias:Atrial Fibrillation,                 Signs/Symptoms:Syncope; Risk Factors:Hypertension.  Sonographer:    Delcie Roch Referring Phys: [id number] BRADLEY S CHOTINER IMPRESSIONS  1. Left ventricular ejection fraction, by estimation, is 55 to 60%. The left ventricle has normal function. The left ventricle has no regional wall motion abnormalities. Left ventricular diastolic function could not be evaluated. There is the interventricular septum is flattened in systole and diastole, consistent with right ventricular pressure and volume overload.  2. Right ventricular systolic function is mildly reduced. The right ventricular size is severely enlarged. Mildly increased right ventricular wall thickness. There is severely elevated pulmonary artery systolic pressure.  3. Left atrial size was moderately dilated.  4. Right atrial size was severely dilated.  5. The mitral valve is normal in structure. Mild mitral valve regurgitation.  6. Tricuspid valve regurgitation is severe.  7. The aortic valve is normal in structure. Aortic valve regurgitation is not visualized. Mild aortic valve sclerosis is present,  with no evidence of aortic valve stenosis.  8. The inferior vena cava is dilated in size with >50% respiratory variability, suggesting right atrial pressure of 8 mmHg. Comparison(s): No prior Echocardiogram. FINDINGS  Left Ventricle: Left ventricular ejection fraction, by estimation, is 55 to 60%. The left ventricle has normal function. The left ventricle has no regional wall motion abnormalities. The left ventricular internal cavity size was normal in size.  There is  no left ventricular hypertrophy. The interventricular septum is flattened in systole and diastole, consistent with right ventricular pressure and volume overload. Left ventricular diastolic function could not be evaluated due to atrial fibrillation. Left ventricular diastolic function could not be evaluated. Right Ventricle: The right ventricular size is severely enlarged. Mildly increased right ventricular wall thickness. Right ventricular systolic function is mildly reduced. There is severely elevated pulmonary artery systolic pressure. The tricuspid regurgitant velocity is 4.58 m/s, and with an assumed right atrial pressure of 8 mmHg, the estimated right ventricular systolic pressure is 91.9 mmHg. Left Atrium: Left atrial size was moderately dilated. Right Atrium: Right atrial size was severely dilated. Pericardium: There is no evidence of pericardial effusion. Mitral Valve: The mitral valve is normal in structure. Mild mitral valve regurgitation, with centrally-directed jet. Tricuspid Valve: The tricuspid valve is grossly normal. Tricuspid valve regurgitation is severe. Aortic Valve: The aortic valve is normal in structure. Aortic valve regurgitation is not visualized. Mild aortic valve sclerosis is present, with no evidence of aortic valve stenosis. Pulmonic Valve: The pulmonic valve was normal in structure. Pulmonic valve regurgitation is trivial. Aorta: The aortic root and ascending aorta are structurally normal, with no evidence of dilitation.  Venous: The inferior vena cava is dilated in size with greater than 50% respiratory variability, suggesting right atrial pressure of 8 mmHg. IAS/Shunts: No atrial level shunt detected by color flow Doppler.  LEFT VENTRICLE PLAX 2D LVIDd:         4.30 cm LVIDs:         3.10 cm LV PW:         1.10 cm LV IVS:        1.10 cm LVOT diam:     1.90 cm LV SV:         33 LVOT Area:     2.84 cm  RIGHT VENTRICLE            IVC RV S prime:     9.68 cm/s  IVC diam: 2.20 cm TAPSE (M-mode): 1.4 cm LEFT ATRIUM           RIGHT ATRIUM LA diam:      4.50 cm RA Area:     26.50 cm LA Vol (A4C): 74.7 ml RA Volume:   79.90 ml  AORTIC VALVE LVOT Vmax:   54.10 cm/s LVOT Vmean:  35.900 cm/s LVOT VTI:    0.115 m  AORTA Ao Root diam: 3.50 cm Ao Asc diam:  3.80 cm TRICUSPID VALVE TR Peak grad:   83.9 mmHg TR Vmax:        458.00 cm/s  SHUNTS Systemic VTI:  0.12 m Systemic Diam: 1.90 cm Mihai Croitoru MD Electronically signed by Thurmon Fair MD Signature Date/Time: 05/16/2020/4:16:51 PM    Final      Subjective: No acute issues or events overnight, patient feels quite well, still somewhat dyspneic with exertion but markedly improved from admission, no further orthostatic hypotension, otherwise denies nausea, vomiting, diarrhea, constipation, headache, fevers, chills, chest pain.   Discharge Exam: Vitals:   05/18/20 0850 05/18/20 1203  BP:  112/75  Pulse: 65 63  Resp:  18  Temp:  98.1 F (36.7 C)  SpO2: 90% 97%   Vitals:   05/18/20 0845 05/18/20 0849 05/18/20 0850 05/18/20 1203  BP: 101/66   112/75  Pulse:   65 63  Resp: 18   18  Temp:  97.9 F (36.6 C)  98.1 F (36.7 C)  TempSrc:  Oral  Oral  SpO2:   90% 97%  Weight:      Height:        General: Pt is alert, awake, not in acute distress Cardiovascular: RRR, S1/S2 +, no rubs, no gallops Respiratory: CTA bilaterally, no wheezing, no rhonchi Abdominal: Soft, NT, ND, bowel sounds + Extremities: no edema, no cyanosis    The results of significant diagnostics  from this hospitalization (including imaging, microbiology, ancillary and laboratory) are listed below for reference.     Microbiology: Recent Results (from the past 240 hour(s))  SARS Coronavirus 2 by RT PCR (hospital order, performed in [facility] hospital lab) Nasopharyngeal Nasopharyngeal Swab     Status: None   Collection Time: 05/16/20  3:11 AM   Specimen: Nasopharyngeal Swab  Result Value Ref Range Status   SARS Coronavirus 2 NEGATIVE NEGATIVE Final    Comment: (NOTE) SARS-CoV-2 target nucleic acids are NOT DETECTED.  The SARS-CoV-2 RNA is generally detectable in upper and lower respiratory specimens during the acute phase of infection. The lowest concentration of SARS-CoV-2 viral copies this assay can detect is 250 copies / mL. A negative result does not preclude SARS-CoV-2 infection and should not be used as the sole basis for treatment or other patient management decisions.  A negative result may occur with improper specimen collection / handling, submission of specimen other than nasopharyngeal swab, presence of viral mutation(s) within the areas targeted by this assay, and inadequate number of viral copies (<250 copies / mL). A negative result must be combined with clinical observations, patient history, and epidemiological information.  Fact Sheet for Patients:   BoilerBrush.com.cy  Fact Sheet for Healthcare Providers: https://pope.com/  This test is not yet approved or  cleared by the [place] FDA and has been authorized for detection and/or diagnosis of SARS-CoV-2 by FDA under an Emergency Use Authorization (EUA).  This EUA will remain in effect (meaning this test can be used) for the duration of the COVID-19 declaration under Section 564(b)(1) of the Act, 21 U.S.C. section 360bbb-3(b)(1), unless the authorization is terminated or revoked sooner.  Performed at [facility], [address]  [place], [place] [address]      Labs: BNP (last 3 results) No results for input(s): BNP in the last 8760 hours. Basic Metabolic Panel: Recent Labs  Lab 05/15/20 1944 05/16/20 0433 05/17/20 0829 05/18/20 0610  NA 138 137 135 135  K 4.1 3.6 3.6 3.3*  CL 104 105 101 100  CO2 24 20* 24 23  GLUCOSE 122* 101* 98 93  BUN 13 15 18  26*  CREATININE 1.93* 1.90* 1.76* 1.82*  CALCIUM 9.3 8.8* 9.0 9.0   Liver Function Tests: Recent Labs  Lab 05/15/20 1944  AST 40  ALT 15  ALKPHOS 127*  BILITOT 1.2  PROT 7.1  ALBUMIN 4.0   No results for input(s): LIPASE, AMYLASE in the last 168 hours. No results for input(s): AMMONIA in the last 168 hours. CBC: Recent Labs  Lab 05/15/20 1944 05/16/20 0433  WBC 8.4 7.4  NEUTROABS 6.5  --   HGB 10.8* 10.4*  HCT 34.4* 32.5*  MCV 97.2 95.0  PLT 168 146*   Cardiac Enzymes: No results for input(s): CKTOTAL, CKMB, CKMBINDEX, TROPONINI in the last 168 hours. BNP: Invalid input(s): POCBNP CBG: No results for input(s): GLUCAP in the last 168 hours. D-Dimer No results for input(s): DDIMER in the last 72 hours. Hgb A1c No results for input(s): HGBA1C in the last 72 hours. Lipid Profile No results for  input(s): CHOL, HDL, LDLCALC, TRIG, CHOLHDL, LDLDIRECT in the last 72 hours. Thyroid function studies No results for input(s): TSH, T4TOTAL, T3FREE, THYROIDAB in the last 72 hours.  Invalid input(s): FREET3 Anemia work up No results for input(s): VITAMINB12, FOLATE, FERRITIN, TIBC, IRON, RETICCTPCT in the last 72 hours. Urinalysis    Component Value Date/Time   COLORURINE STRAW (A) 05/16/2020 0040   APPEARANCEUR CLEAR 05/16/2020 0040   LABSPEC 1.004 (L) 05/16/2020 0040   PHURINE 7.0 05/16/2020 0040   GLUCOSEU NEGATIVE 05/16/2020 0040   HGBUR NEGATIVE 05/16/2020 0040   BILIRUBINUR NEGATIVE 05/16/2020 0040   KETONESUR NEGATIVE 05/16/2020 0040   PROTEINUR NEGATIVE 05/16/2020 0040   NITRITE NEGATIVE 05/16/2020 0040   LEUKOCYTESUR MODERATE (A)  05/16/2020 0040   Sepsis Labs Invalid input(s): PROCALCITONIN,  WBC,  LACTICIDVEN Microbiology Recent Results (from the past 240 hour(s))  SARS Coronavirus 2 by RT PCR (hospital order, performed in [facility] hospital lab) Nasopharyngeal Nasopharyngeal Swab     Status: None   Collection Time: 05/16/20  3:11 AM   Specimen: Nasopharyngeal Swab  Result Value Ref Range Status   SARS Coronavirus 2 NEGATIVE NEGATIVE Final    Comment: (NOTE) SARS-CoV-2 target nucleic acids are NOT DETECTED.  The SARS-CoV-2 RNA is generally detectable in upper and lower respiratory specimens during the acute phase of infection. The lowest concentration of SARS-CoV-2 viral copies this assay can detect is 250 copies / mL. A negative result does not preclude SARS-CoV-2 infection and should not be used as the sole basis for treatment or other patient management decisions.  A negative result may occur with improper specimen collection / handling, submission of specimen other than nasopharyngeal swab, presence of viral mutation(s) within the areas targeted by this assay, and inadequate number of viral copies (<250 copies / mL). A negative result must be combined with clinical observations, patient history, and epidemiological information.  Fact Sheet for Patients:   BoilerBrush.com.cy  Fact Sheet for Healthcare Providers: https://pope.com/  This test is not yet approved or  cleared by the [place] FDA and has been authorized for detection and/or diagnosis of SARS-CoV-2 by FDA under an Emergency Use Authorization (EUA).  This EUA will remain in effect (meaning this test can be used) for the duration of the COVID-19 declaration under Section 564(b)(1) of the Act, 21 U.S.C. section 360bbb-3(b)(1), unless the authorization is terminated or revoked sooner.  Performed at [facility], [address], [place], [place] [address]      Time coordinating  discharge: Over 30 minutes  SIGNED:   Azucena Fallen, DO [facility] 05/18/2020, 12:06 PM Pager   If 7PM-7AM, please contact night-coverage [email or web address]

## 2020-06-18 DIAGNOSIS — R55 Syncope and collapse: Secondary | ICD-10-CM

## 2020-06-18 DIAGNOSIS — I340 Nonrheumatic mitral (valve) insufficiency: Secondary | ICD-10-CM

## 2020-06-18 DIAGNOSIS — I361 Nonrheumatic tricuspid (valve) insufficiency: Secondary | ICD-10-CM

## 2020-08-26 DEATH — deceased

## 2022-03-14 IMAGING — CT CT HEAD W/O CM
3 of 4 series · 15 of 47 positions shown, 18 images · non-contrast
Comparison: 07/06/2018

CLINICAL DATA: Syncope.  Hematoma to posterior head.

EXAM:
CT HEAD WITHOUT CONTRAST
TECHNIQUE: Contiguous axial images were obtained from the base of the skull
through the vertex without intravenous contrast.

[Series 4: head 2.0 h70h · axial · 0.41mm/px · z∈[+1254,+1382]mm · 9 of 80 slices shown, 12 images]
[im 8/80  brain]
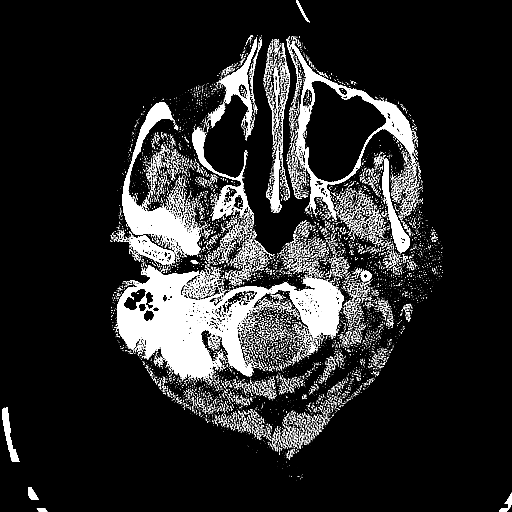
[im 8/80  bone]
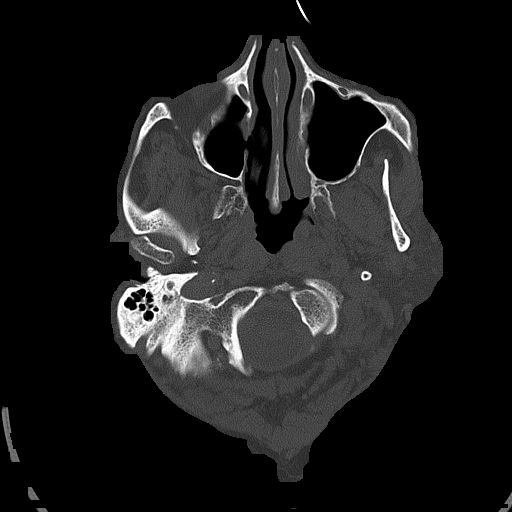
[im 16/80  brain]
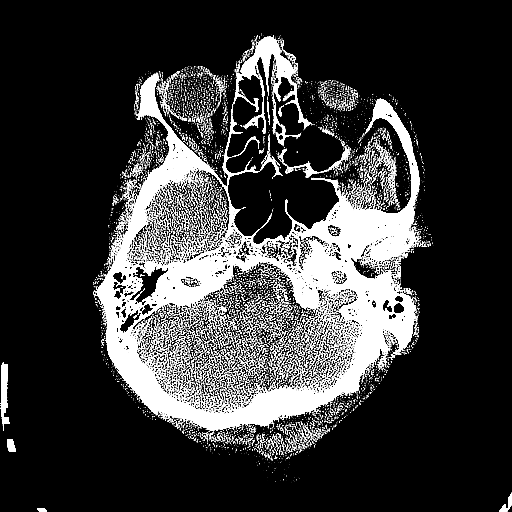
[im 24/80  brain]
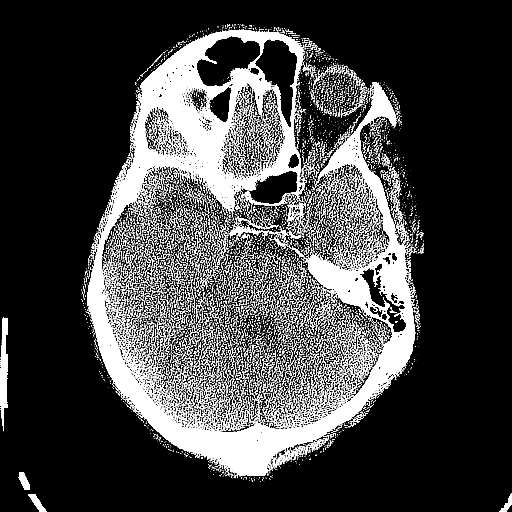
[im 32/80  brain]
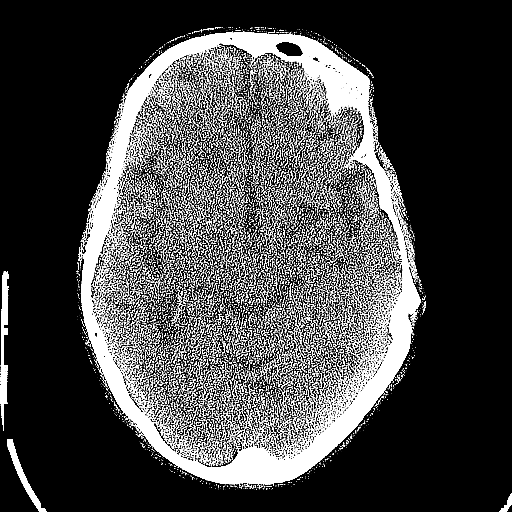
[im 40/80  brain]
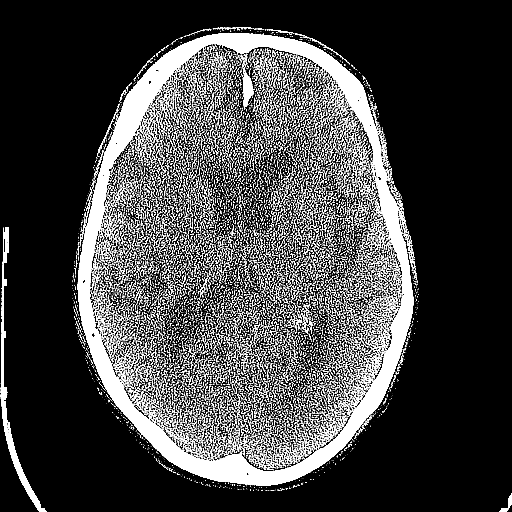
[im 40/80  bone]
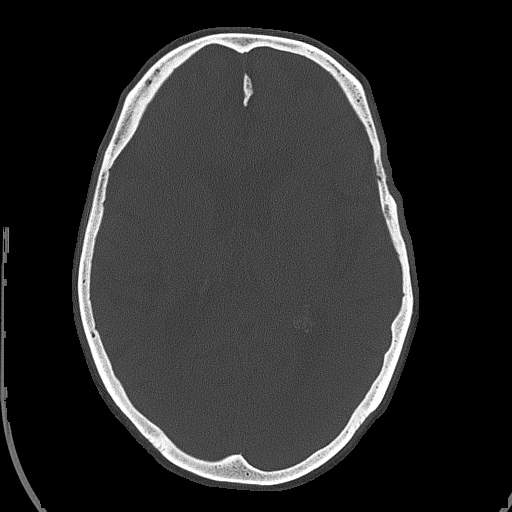
[im 48/80  brain]
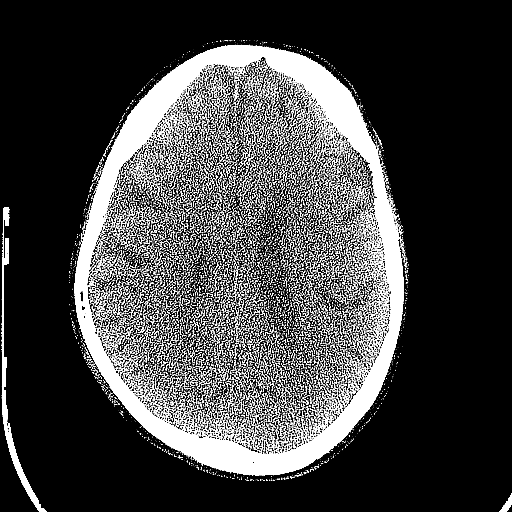
[im 56/80  brain]
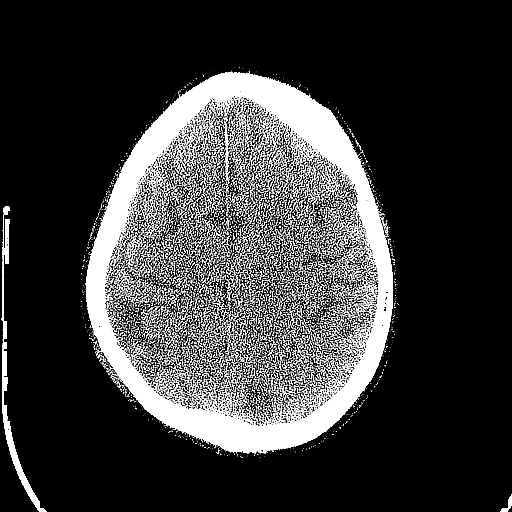
[im 64/80  brain]
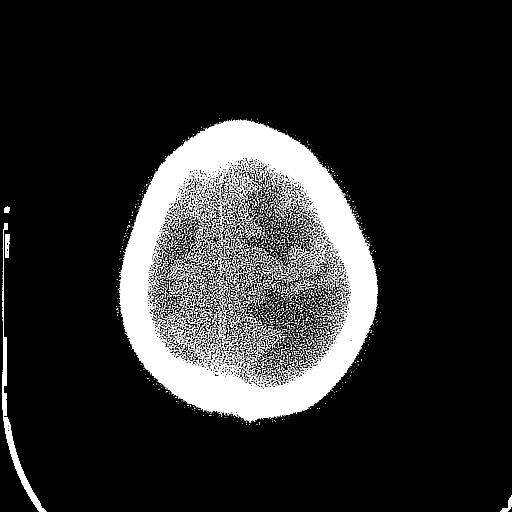
[im 72/80  brain]
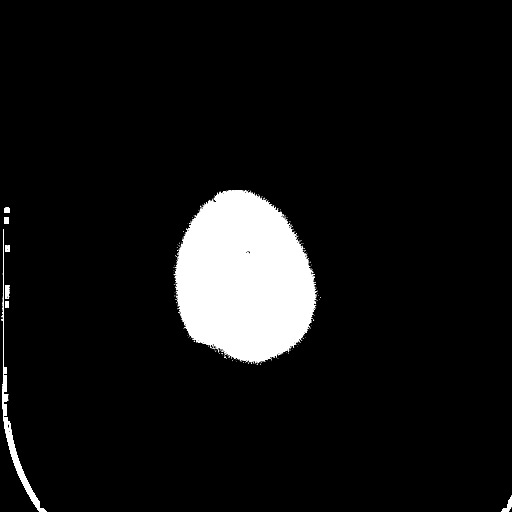
[im 72/80  bone]
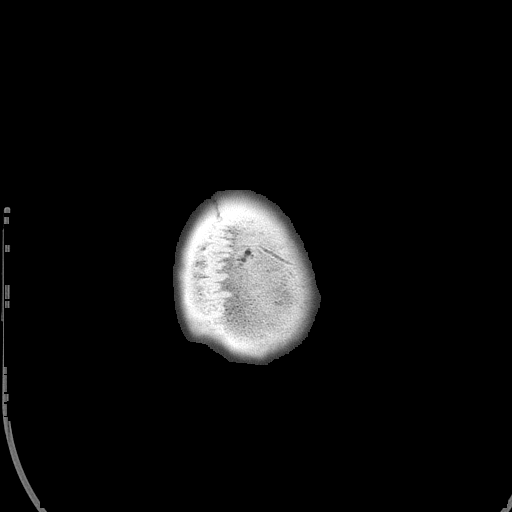

[Series 5: head 3.0 mpr cor · coronal · 0.32mm/px · 3 of 69 slices shown]
[im 23/69  brain]
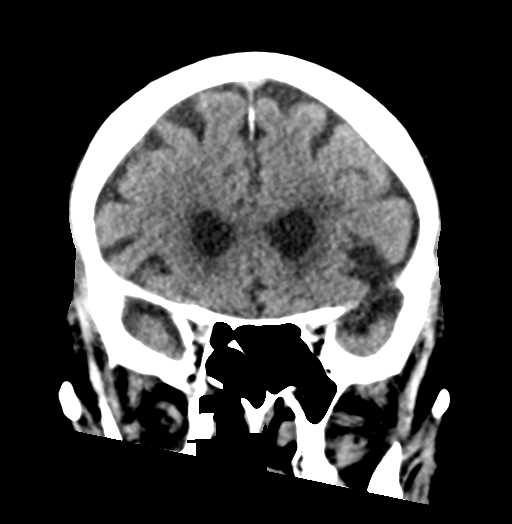
[im 31/69  brain]
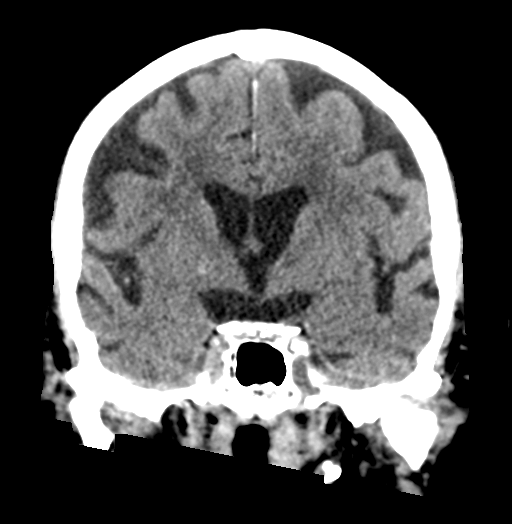
[im 38/69  brain]
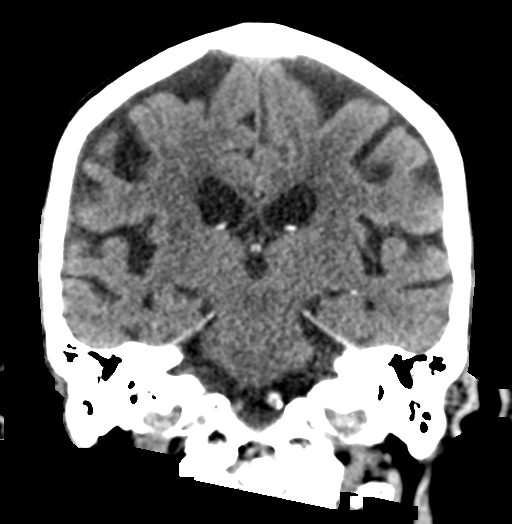

[Series 6: head 3.0 mpr sag · sagittal · 0.31mm/px · 3 of 55 slices shown]
[im 21/55  brain]
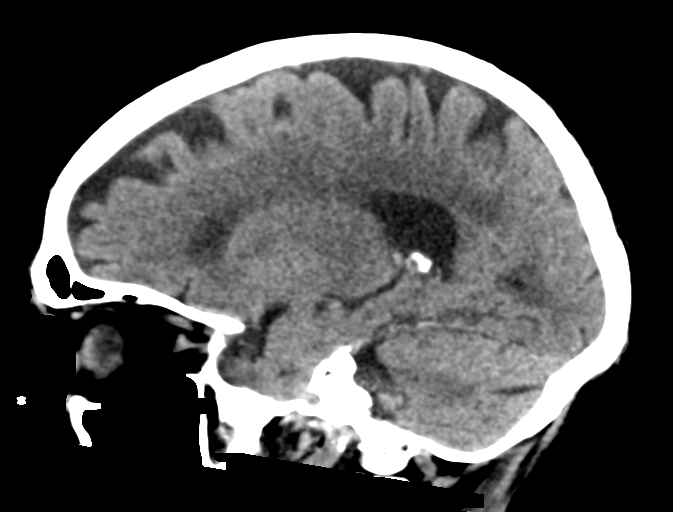
[im 28/55  brain]
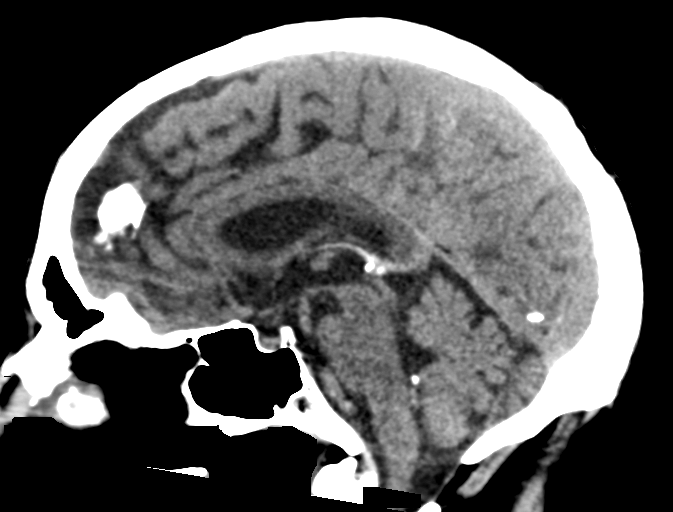
[im 35/55  brain]
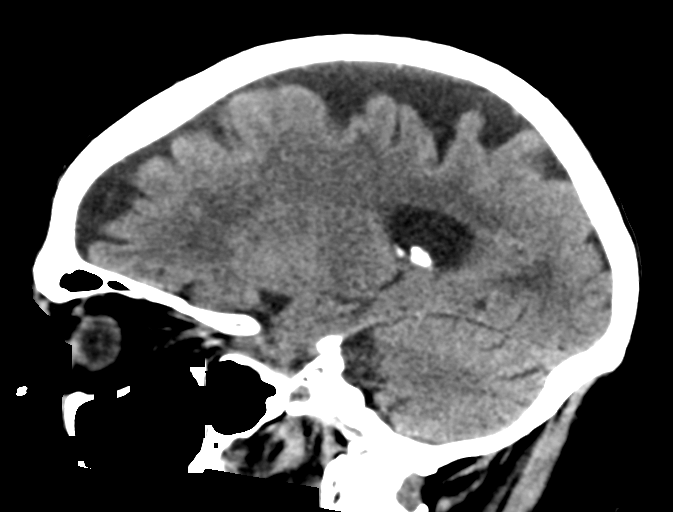

[15 of 47 positions shown; findings below may reference images not displayed]

FINDINGS: Brain: No evidence of acute infarction, hemorrhage, hydrocephalus,
extra-axial collection or mass lesion/mass effect. There is mild
diffuse low-attenuation within the subcortical and periventricular
white matter compatible with chronic microvascular disease.

Vascular: No hyperdense vessel or unexpected calcification.

Skull: Normal. Negative for fracture or focal lesion.

Sinuses/Orbits: No acute finding.

Other: Right posterior scalp laceration and hematoma measures 0.9 x
3.6 cm, image [DATE]
IMPRESSION: 1. No acute intracranial abnormalities.
2. Chronic small vessel ischemic disease and brain atrophy.
3. Right posterior scalp laceration and hematoma.
